# Patient Record
Sex: Female | Born: 1970 | Race: White | Hispanic: No | Marital: Married | Smoking: Former smoker
Health system: Southern US, Community
[De-identification: ages and names within clinical notes are randomized; demographics above are authoritative.]

## PROBLEM LIST (undated history)

## (undated) DIAGNOSIS — M5030 Other cervical disc degeneration, unspecified cervical region: Secondary | ICD-10-CM

## (undated) DIAGNOSIS — F419 Anxiety disorder, unspecified: Secondary | ICD-10-CM

## (undated) DIAGNOSIS — C801 Malignant (primary) neoplasm, unspecified: Secondary | ICD-10-CM

## (undated) DIAGNOSIS — K219 Gastro-esophageal reflux disease without esophagitis: Secondary | ICD-10-CM

## (undated) DIAGNOSIS — J45909 Unspecified asthma, uncomplicated: Secondary | ICD-10-CM

## (undated) HISTORY — PX: LASER ABLATION CONDYLOMA CERVICAL / VULVAR: SUR819

## (undated) HISTORY — PX: CHOLECYSTECTOMY: SHX55

## (undated) HISTORY — PX: APPENDECTOMY: SHX54

---

## 2015-03-17 ENCOUNTER — Encounter (HOSPITAL_COMMUNITY): Payer: Self-pay | Admitting: Cardiology

## 2015-03-17 ENCOUNTER — Emergency Department (HOSPITAL_COMMUNITY)
Admission: EM | Admit: 2015-03-17 | Discharge: 2015-03-17 | Disposition: A | Discharge status: Home / Self Care | Payer: Self-pay | Attending: Emergency Medicine | Admitting: Emergency Medicine

## 2015-03-17 ENCOUNTER — Emergency Department (HOSPITAL_COMMUNITY): Payer: Self-pay

## 2015-03-17 DIAGNOSIS — Z3202 Encounter for pregnancy test, result negative: Secondary | ICD-10-CM | POA: Insufficient documentation

## 2015-03-17 DIAGNOSIS — Z720 Tobacco use: Secondary | ICD-10-CM | POA: Insufficient documentation

## 2015-03-17 DIAGNOSIS — M546 Pain in thoracic spine: Secondary | ICD-10-CM | POA: Insufficient documentation

## 2015-03-17 DIAGNOSIS — M542 Cervicalgia: Secondary | ICD-10-CM | POA: Insufficient documentation

## 2015-03-17 DIAGNOSIS — R079 Chest pain, unspecified: Secondary | ICD-10-CM | POA: Insufficient documentation

## 2015-03-17 HISTORY — DX: Other cervical disc degeneration, unspecified cervical region: M50.30

## 2015-03-17 LAB — URINALYSIS, ROUTINE W REFLEX MICROSCOPIC
BILIRUBIN URINE: NEGATIVE
Glucose, UA: NEGATIVE mg/dL
Hgb urine dipstick: NEGATIVE
Ketones, ur: NEGATIVE mg/dL
Leukocytes, UA: NEGATIVE
NITRITE: NEGATIVE
PROTEIN: NEGATIVE mg/dL
SPECIFIC GRAVITY, URINE: 1.02 (ref 1.005–1.030)
UROBILINOGEN UA: 0.2 mg/dL (ref 0.0–1.0)
pH: 6 (ref 5.0–8.0)

## 2015-03-17 LAB — COMPREHENSIVE METABOLIC PANEL
ALBUMIN: 4 g/dL (ref 3.5–5.0)
ALT: 19 U/L (ref 14–54)
ANION GAP: 5 (ref 5–15)
AST: 20 U/L (ref 15–41)
Alkaline Phosphatase: 94 U/L (ref 38–126)
BUN: 14 mg/dL (ref 6–20)
CO2: 28 mmol/L (ref 22–32)
Calcium: 9.5 mg/dL (ref 8.9–10.3)
Chloride: 107 mmol/L (ref 101–111)
Creatinine, Ser: 0.65 mg/dL (ref 0.44–1.00)
GFR calc Af Amer: >60 mL/min (ref >60)
GFR calc non Af Amer: >60 mL/min (ref >60)
GLUCOSE: 101 mg/dL — AB (ref 65–99)
POTASSIUM: 3.8 mmol/L (ref 3.5–5.1)
SODIUM: 140 mmol/L (ref 135–145)
Total Bilirubin: 0.6 mg/dL (ref 0.3–1.2)
Total Protein: 6.9 g/dL (ref 6.5–8.1)

## 2015-03-17 LAB — CBC WITH DIFFERENTIAL/PLATELET
BASOS PCT: 1 % (ref 0–1)
Basophils Absolute: 0.1 10*3/uL (ref 0.0–0.1)
EOS ABS: 0.1 10*3/uL (ref 0.0–0.7)
Eosinophils Relative: 1 % (ref 0–5)
HCT: 46.8 % — ABNORMAL HIGH (ref 36.0–46.0)
Hemoglobin: 16.2 g/dL — ABNORMAL HIGH (ref 12.0–15.0)
Lymphocytes Relative: 20 % (ref 12–46)
Lymphs Abs: 2.2 10*3/uL (ref 0.7–4.0)
MCH: 32.1 pg (ref 26.0–34.0)
MCHC: 34.6 g/dL (ref 30.0–36.0)
MCV: 92.7 fL (ref 78.0–100.0)
MONO ABS: 0.8 10*3/uL (ref 0.1–1.0)
MONOS PCT: 7 % (ref 3–12)
NEUTROS PCT: 71 % (ref 43–77)
Neutro Abs: 8 10*3/uL — ABNORMAL HIGH (ref 1.7–7.7)
PLATELETS: 236 10*3/uL (ref 150–400)
RBC: 5.05 MIL/uL (ref 3.87–5.11)
RDW: 14.4 % (ref 11.5–15.5)
WBC: 11.2 10*3/uL — ABNORMAL HIGH (ref 4.0–10.5)

## 2015-03-17 LAB — TROPONIN I: Troponin I: <0.03 ng/mL (ref <0.031)

## 2015-03-17 LAB — POC URINE PREG, ED: PREG TEST UR: NEGATIVE

## 2015-03-17 MED ORDER — TRAMADOL HCL 50 MG PO TABS: 50.0000 mg | ORAL_TABLET | Freq: Four times a day (QID) | ORAL | Status: DC | PRN

## 2015-03-17 MED ORDER — IBUPROFEN 800 MG PO TABS: 800.0000 mg | ORAL_TABLET | Freq: Three times a day (TID) | ORAL | Status: DC

## 2015-03-17 MED ORDER — MORPHINE SULFATE (PF) 4 MG/ML IV SOLN
4.0000 mg | Freq: Once | INTRAVENOUS | Status: AC
  Administered 2015-03-17: 4 mg via INTRAVENOUS
  Filled 2015-03-17: qty 1

## 2015-03-17 MED ORDER — CYCLOBENZAPRINE HCL 10 MG PO TABS: 10.0000 mg | ORAL_TABLET | Freq: Three times a day (TID) | ORAL | Status: DC | PRN

## 2015-03-17 MED ORDER — IOHEXOL 350 MG/ML SOLN
100.0000 mL | Freq: Once | INTRAVENOUS | Status: AC | PRN
  Administered 2015-03-17: 100 mL via INTRAVENOUS

## 2015-03-17 MED ORDER — ONDANSETRON HCL 4 MG/2ML IJ SOLN
4.0000 mg | Freq: Once | INTRAMUSCULAR | Status: AC
  Administered 2015-03-17: 4 mg via INTRAVENOUS
  Filled 2015-03-17: qty 2

## 2015-03-17 NOTE — ED Notes (Signed)
Chest pain and pain between shoulder blades since lunch.

## 2015-03-17 NOTE — ED Notes (Signed)
Patient requesting pain medication. MD notified.  

## 2015-03-17 NOTE — ED Provider Notes (Signed)
CSN: [id number]     Arrival date & time 03/17/15  1717 History   First MD Initiated Contact with Patient 03/17/15 1724     Chief Complaint  Patient presents with  . Chest Pain     (Consider location/radiation/quality/duration/timing/severity/associated sxs/prior Treatment) HPI Comments: Patient presents to the ER for evaluation of left-sided chest pain and pain between her shoulder blades. Patient reports that symptoms began 45 hours ago. She laid down into the nap, but pain was still there when she woke up. She did take an aspirin prior to arrival. Patient is not expressing any shortness of breath. She does report that she is very uncomfortable, pain worsens when she moves.  Patient is a 44 y.o. female presenting with chest pain.  Chest Pain Associated symptoms: back pain     Past Medical History  Diagnosis Date  . DDD (degenerative disc disease), cervical    Past Surgical History  Procedure Laterality Date  . Cholecystectomy    . Appendectomy     History reviewed. No pertinent family history. Social History  Substance Use Topics  . Smoking status: Current Every Day Smoker  . Smokeless tobacco: None  . Alcohol Use: Yes     Comment: occasional   OB History    No data available     Review of Systems  Cardiovascular: Positive for chest pain.  Musculoskeletal: Positive for back pain.  All other systems reviewed and are negative.     Allergies  Clindamycin/lincomycin  Home Medications   Prior to Admission medications   Not on File   BP 124/67 mmHg  Pulse 62  Temp(Src) 98.7 F (37.1 C) (Oral)  Resp 16  Ht 5\' 5"  (1.651 m)  Wt 203 lb (92.08 kg)  BMI 33.78 kg/m2  SpO2 100%  LMP 02/23/2015 Physical Exam  Constitutional: She is oriented to person, place, and time. She appears well-developed and well-nourished. No distress.  HENT:  Head: Normocephalic and atraumatic.  Right Ear: Hearing normal.  Left Ear: Hearing normal.  Nose: Nose normal.  Mouth/Throat:  Oropharynx is clear and moist and mucous membranes are normal.  Eyes: Conjunctivae and EOM are normal. Pupils are equal, round, and reactive to light.  Neck: Neck supple. Muscular tenderness (left paraspinal) present. Decreased range of motion (due to pain) present.  Cardiovascular: Regular rhythm, S1 normal and S2 normal.  Exam reveals no gallop and no friction rub.   No murmur heard. Pulmonary/Chest: Effort normal and breath sounds normal. No respiratory distress. She exhibits no tenderness.  Abdominal: Soft. Normal appearance and bowel sounds are normal. There is no hepatosplenomegaly. There is no tenderness. There is no rebound, no guarding, no tenderness at McBurney's point and negative Murphy's sign. No hernia.  Musculoskeletal:       Thoracic back: She exhibits tenderness.       Back:  Neurological: She is alert and oriented to person, place, and time. She has normal strength. No cranial nerve deficit or sensory deficit. Coordination normal. GCS eye subscore is 4. GCS verbal subscore is 5. GCS motor subscore is 6.  Skin: Skin is warm, dry and intact. No rash noted. No cyanosis.  Psychiatric: She has a normal mood and affect. Her speech is normal and behavior is normal. Thought content normal.  Nursing note and vitals reviewed.   ED Course  Procedures (including critical care time) Labs Review Labs Reviewed  CBC WITH DIFFERENTIAL/PLATELET - Abnormal; Notable for the following:    WBC 11.2 (*)    Hemoglobin 16.2 (*)  HCT 46.8 (*)    Neutro Abs 8.0 (*)    All other components within normal limits  COMPREHENSIVE METABOLIC PANEL - Abnormal; Notable for the following:    Glucose, Bld 101 (*)    All other components within normal limits  TROPONIN I  URINALYSIS, ROUTINE W REFLEX MICROSCOPIC (NOT AT [facility])  TROPONIN I  POC URINE PREG, ED    Imaging Review Ct Angio Chest Pe W/cm &/or Wo Cm  03/17/2015   CLINICAL DATA:  Acute onset of interscapular posterior chest pain which began  earlier today.  EXAM: CT ANGIOGRAPHY CHEST WITH CONTRAST  TECHNIQUE: Multidetector CT imaging of the chest was performed using the standard protocol during bolus administration of intravenous contrast. Multiplanar CT image reconstructions and MIPs were obtained to evaluate the vascular anatomy.  CONTRAST:  100 mL OMNIPAQUE IOHEXOL 350 MG/ML IV.  COMPARISON:  None.  FINDINGS: Contrast opacification of pulmonary arteries is good. No filling defects within either main pulmonary artery or their branches in either lung to suggest pulmonary embolism. Heart size normal. No visible coronary atherosclerosis. No visible atherosclerosis involving the thoracic or upper abdominal aorta. Widely patent great vessels. No pericardial effusion.  Mild emphysematous changes in the upper lobes. Pulmonary parenchyma clear without localized airspace consolidation, interstitial disease, or parenchymal nodules or masses. Central airways patent with mild central bronchial wall thickening. No pleural effusions.  No significant mediastinal, hilar or axillary lymphadenopathy. Visualized thyroid gland unremarkable.  Anatomic variant in that the left lobe of the liver extends across the midline into the left upper quadrant. Gallbladder surgically absent. Visualized upper abdomen unremarkable. Bone window images unremarkable apart from minimal lower thoracic spondylosis.  Review of the MIP images confirms the above findings.  IMPRESSION: 1. No evidence of pulmonary embolism. 2. Mild emphysematous changes in the upper lobes. No acute cardiopulmonary disease.   Electronically Signed   By: Evangeline Dakin M.D.   On: 03/17/2015 20:26   I have personally reviewed and evaluated these images and lab results as part of my medical decision-making.   EKG Interpretation   Date/Time:  Monday March 17 2015 17:27:38 EDT Ventricular Rate:  76 PR Interval:  158 QRS Duration: 79 QT Interval:  365 QTC Calculation: 410 R Axis:   67 Text  Interpretation:  Sinus rhythm Normal ECG Confirmed by Tra Wilemon  MD,  Renley Banwart [phone]) on 03/17/2015 5:35:07 PM      MDM   Final diagnoses:  Chest pain, unspecified chest pain type  Left-sided thoracic back pain    Patient presented to the ER for evaluation of back and chest pain. Pain is between the shoulder blades and goes into the left side of her neck. This pain is worsened with movement. She denied injury. Patient did have reproducible pain with movements here in the ER. EKG was normal. Troponin was negative. CT angiography of chest was performed to evaluate for PE and aortic dissection. No abnormality was noted. Symptoms most consistent with musculoskeletal pain. Second troponin performed and negative. Discharged with analgesia, rest.    Orpah Greek, MD 03/17/15 2037

## 2015-03-17 NOTE — Discharge Instructions (Signed)
Back Pain, Adult °Back pain is very common. The pain often gets better over time. The cause of back pain is usually not dangerous. Most people can learn to manage their back pain on their own.  °HOME CARE  °· Stay active. Start with short walks on flat ground if you can. Try to walk farther each day. °· Do not sit, drive, or stand in one place for more than 30 minutes. Do not stay in bed. °· Do not avoid exercise or work. Activity can help your back heal faster. °· Be careful when you bend or lift an object. Bend at your knees, keep the object close to you, and do not twist. °· Sleep on a firm mattress. Lie on your side, and bend your knees. If you lie on your back, put a pillow under your knees. °· Only take medicines as told by your doctor. °· Put ice on the injured area. °¨ Put ice in a plastic bag. °¨ Place a towel between your skin and the bag. °¨ Leave the ice on for 15-20 minutes, 03-04 times a day for the first 2 to 3 days. After that, you can switch between ice and heat packs. °· Ask your doctor about back exercises or massage. °· Avoid feeling anxious or stressed. Find good ways to deal with stress, such as exercise. °GET HELP RIGHT AWAY IF:  °· Your pain does not go away with rest or medicine. °· Your pain does not go away in 1 week. °· You have new problems. °· You do not feel well. °· The pain spreads into your legs. °· You cannot control when you poop (bowel movement) or pee (urinate). °· Your arms or legs feel weak or lose feeling (numbness). °· You feel sick to your stomach (nauseous) or throw up (vomit). °· You have belly (abdominal) pain. °· You feel like you may pass out (faint). °MAKE SURE YOU:  °· Understand these instructions. °· Will watch your condition. °· Will get help right away if you are not doing well or get worse. °Document Released: 12/22/2007 Document Revised: 09/27/2011 Document Reviewed: 11/06/2013 °Exit[employer]® Patient Information ©2015 [employer]. This information is not intended  to replace advice given to you by your health care provider. Make sure you discuss any questions you have with your health care provider. ° °

## 2017-04-04 ENCOUNTER — Other Ambulatory Visit: Payer: Self-pay | Admitting: Neurosurgery

## 2017-04-05 ENCOUNTER — Encounter: Payer: Self-pay | Admitting: Vascular Surgery

## 2017-04-06 ENCOUNTER — Encounter: Payer: Self-pay | Admitting: Vascular Surgery

## 2017-04-06 ENCOUNTER — Ambulatory Visit (INDEPENDENT_AMBULATORY_CARE_PROVIDER_SITE_OTHER): Payer: BLUE CROSS/BLUE SHIELD | Admitting: Vascular Surgery

## 2017-04-06 VITALS — BP 135/81 | HR 76 | Temp 97.1°F | Resp 18 | Ht 65.0 in | Wt 205.0 lb

## 2017-04-06 DIAGNOSIS — M5137 Other intervertebral disc degeneration, lumbosacral region: Secondary | ICD-10-CM

## 2017-04-06 NOTE — Progress Notes (Signed)
[facility] [place]  Patient name: Amber Duarte MRN: [id number] DOB: January 23, 1971 Sex: female  REASON FOR CONSULT: Discussed my role for exposure for L5-S1 disc surgery  HPI: Amber Duarte is a 46 y.o. female, who is seen today for discussion of anterior exposure for L5-S1 disc surgery. He is very pleasant 46 year old who reports falling January of last year. She's had progressive pain associated with this and now is incapacitated with this. She walks with a rolling walker. She has had failed conservative treatment of her degenerative disc disease. She requires constant narcotics and is getting minimal relief with this. She has pain in her back and also pain down both of her lower extremities. Is no history of cardiac disease and no history of peripheral vascular occlusive disease. She has had prior abdominal surgery with laparoscopic cholecystectomy and also appendectomy. She had cervical cancer is a young woman and had the laser treatment of this.  Past Medical History:  Diagnosis Date  . DDD (degenerative disc disease), cervical     History reviewed. No pertinent family history.  SOCIAL HISTORY: Social History   Social History  . Marital status: Divorced    Spouse name: N/A  . Number of children: N/A  . Years of education: N/A   Occupational History  . Not on file.   Social History Main Topics  . Smoking status: Current Every Day Smoker    Packs/day: 1.00    Types: Cigarettes  . Smokeless tobacco: Never Used  . Alcohol use Yes     Comment: occasional  . Drug use: No  . Sexual activity: Not on file   Other Topics Concern  . Not on file   Social History Narrative  . No narrative on file    Allergies  Allergen Reactions  . Clindamycin/Lincomycin Anaphylaxis    Current Outpatient Prescriptions  Medication Sig Dispense Refill  . carisoprodol (SOMA) 350 MG tablet TK 1 T PO Q 6 H PRN  1  .  HYDROcodone-acetaminophen (NORCO/VICODIN) 5-325 MG tablet TK 1 T PO Q 6 H PRF PAIN  0  . ranitidine (ZANTAC) 150 MG tablet Take 300 mg by mouth 2 (two) times daily.    Marland Kitchen aspirin EC 81 MG tablet Take 81 mg by mouth once as needed for mild pain or moderate pain.    . cyclobenzaprine (FLEXERIL) 10 MG tablet Take 1 tablet (10 mg total) by mouth 3 (three) times daily as needed for muscle spasms. (Patient not taking: Reported on 04/06/2017) 20 tablet 0  . ibuprofen (ADVIL,MOTRIN) 800 MG tablet Take 1 tablet (800 mg total) by mouth 3 (three) times daily. (Patient not taking: Reported on 04/06/2017) 21 tablet 0  . traMADol (ULTRAM) 50 MG tablet Take 1 tablet (50 mg total) by mouth every 6 (six) hours as needed. (Patient not taking: Reported on 04/06/2017) 15 tablet 0   No current facility-administered medications for this visit.     REVIEW OF SYSTEMS:  [X]  denotes positive finding, [ ]  denotes negative finding Cardiac  Comments:  Chest pain or chest pressure:    Shortness of breath upon exertion:    Short of breath when lying flat:    Irregular heart rhythm:        Vascular    Pain in calf, thigh, or hip brought on by ambulation: x Neurogenic   Pain in feet at night that wakes you up from your sleep:     Blood clot in your veins:    Leg  swelling:         Pulmonary    Oxygen at home:    Productive cough:     Wheezing:         Neurologic    Sudden weakness in arms or legs:  x   Sudden numbness in arms or legs:  x   Sudden onset of difficulty speaking or slurred speech:    Temporary loss of vision in one eye:     Problems with dizziness:         Gastrointestinal    Blood in stool:     Vomited blood:         Genitourinary    Burning when urinating:     Blood in urine:        Psychiatric    Major depression:         Hematologic    Bleeding problems:    Problems with blood clotting too easily:        Skin    Rashes or ulcers:        Constitutional    Fever or chills:       PHYSICAL EXAM: Vitals:   04/06/17 0859  BP: 135/81  Pulse: 76  Resp: 18  Temp: (!) 97.1 F (36.2 C)  TempSrc: Oral  SpO2: 98%  Weight: 205 lb (93 kg)  Height: 5\' 5"  (1.651 m)    GENERAL: The patient is a well-nourished female, in no acute distress. The vital signs are documented above. CARDIOVASCULAR: Palpable radial and palpable dorsalis pedis pulses bilaterally. Carotid arteries without bruits bilaterally PULMONARY: There is good air exchange  ABDOMEN: Soft and non-tender  MUSCULOSKELETAL: There are no major deformities or cyanosis. NEUROLOGIC: No focal weakness or paresthesias are detected. SKIN: There are no ulcers or rashes noted. PSYCHIATRIC: The patient has a normal affect.    MEDICAL ISSUES: Severe degenerative disc disease. Dr. Trenton Gammon has recommended surgery for improvement of her pain. Has recommended treatment of the L5-S1 disc from an anterior approach. I explained my role and exposure. Explained the transverse incision in the left lower quadrant and mobilization of the rectus muscle. Discussed retroperitoneal exposure and mobilization of the left ureter and also mobilization of the arterial venous structures overlying the spine. Discussed the potential of injury for all of these. Also discussed the possibility of a postop wound difficulty particularly in light of her obesity.  I do not see any contraindications to the anterior exposure from my standpoint. She has had no prior pelvic surgery. She is obese making this exposure somewhat more challenging but not prohibitive. We are available for assistance when she is scheduled for surgery   Rosetta Posner, MD [facility] Tel [phone] Pager [phone]

## 2017-04-11 ENCOUNTER — Other Ambulatory Visit: Payer: Self-pay

## 2017-04-11 NOTE — Pre-Procedure Instructions (Signed)
Rosella Crandell  04/11/2017      [id number] - [place], [place] - [address] [place] [address] Phone: [phone] Fax: [phone]  [facility] [place], [place] [address] Phone: [phone] Fax: [phone]    Your procedure is scheduled on Monday October 1.  Report to [facility] Admitting at 5:30 A.M.  Call this number if you have problems the morning of surgery:  [phone]   Remember:  Do not eat food or drink liquids after midnight.  Take these medicines the morning of surgery with A SIP OF WATER: ranitidine (zantac), hydrocodone (Norco) if needed, carisoprodol (soma)   7 days prior to surgery STOP taking any Aspirin, Aleve, Naproxen, Ibuprofen, Motrin, Advil, Goody's, BC's, all herbal medications, fish oil, and all vitamins    Do not wear jewelry, make-up or nail polish.  Do not wear lotions, powders, or perfumes, or deoderant.  Do not shave 48 hours prior to surgery.  Men may shave face and neck.  Do not bring valuables to the hospital.  [facility] is not responsible for any belongings or valuables.  Contacts, dentures or bridgework may not be worn into surgery.  Leave your suitcase in the car.  After surgery it may be brought to your room.  For patients admitted to the hospital, discharge time will be determined by your treatment team.  Patients discharged the day of surgery will not be allowed to drive home.    Special instructions:    [facility]- Preparing For Surgery  Before surgery, you can play an important role. Because skin is not sterile, your skin needs to be as free of germs as possible. You can reduce the number of germs on your skin by washing with CHG (chlorahexidine gluconate) Soap before surgery.  CHG is an antiseptic cleaner which kills germs and bonds with the skin to continue killing germs even  after washing.  Please do not use if you have an allergy to CHG or antibacterial soaps. If your skin becomes reddened/irritated stop using the CHG.  Do not shave (including legs and underarms) for at least 48 hours prior to first CHG shower. It is OK to shave your face.  Please follow these instructions carefully.   1. Shower the NIGHT BEFORE SURGERY and the MORNING OF SURGERY with CHG.   2. If you chose to wash your hair, wash your hair first as usual with your normal shampoo.  3. After you shampoo, rinse your hair and body thoroughly to remove the shampoo.  4. Use CHG as you would any other liquid soap. You can apply CHG directly to the skin and wash gently with a scrungie or a clean washcloth.   5. Apply the CHG Soap to your body ONLY FROM THE NECK DOWN.  Do not use on open wounds or open sores. Avoid contact with your eyes, ears, mouth and genitals (private parts). Wash genitals (private parts) with your normal soap.  6. Wash thoroughly, paying special attention to the area where your surgery will be performed.  7. Thoroughly rinse your body with warm water from the neck down.  8. DO NOT shower/wash with your normal soap after using and rinsing off the CHG Soap.  9. Pat yourself dry with a CLEAN TOWEL.   10. Wear CLEAN PAJAMAS   11. Place CLEAN SHEETS on your  bed the night of your first shower and DO NOT SLEEP WITH PETS.    Day of Surgery: Do not apply any deodorants/lotions. Please wear clean clothes to the hospital/surgery center.      Please read over the following fact sheets that you were given. MRSA Information

## 2017-04-12 ENCOUNTER — Encounter (HOSPITAL_COMMUNITY)
Admission: RE | Admit: 2017-04-12 | Discharge: 2017-04-12 | Disposition: A | Discharge status: Home / Self Care | Payer: BLUE CROSS/BLUE SHIELD | Source: Ambulatory Visit | Attending: Neurosurgery | Admitting: Neurosurgery

## 2017-04-12 ENCOUNTER — Encounter (HOSPITAL_COMMUNITY): Payer: Self-pay | Admitting: Urology

## 2017-04-12 DIAGNOSIS — Z0183 Encounter for blood typing: Secondary | ICD-10-CM | POA: Diagnosis not present

## 2017-04-12 DIAGNOSIS — M5126 Other intervertebral disc displacement, lumbar region: Secondary | ICD-10-CM | POA: Diagnosis not present

## 2017-04-12 DIAGNOSIS — Z01812 Encounter for preprocedural laboratory examination: Secondary | ICD-10-CM | POA: Insufficient documentation

## 2017-04-12 HISTORY — DX: Malignant (primary) neoplasm, unspecified: C80.1

## 2017-04-12 HISTORY — DX: Gastro-esophageal reflux disease without esophagitis: K21.9

## 2017-04-12 HISTORY — DX: Anxiety disorder, unspecified: F41.9

## 2017-04-12 LAB — SURGICAL PCR SCREEN
MRSA, PCR: NEGATIVE
STAPHYLOCOCCUS AUREUS: NEGATIVE

## 2017-04-12 LAB — CBC WITH DIFFERENTIAL/PLATELET
BASOS ABS: 0.1 10*3/uL (ref 0.0–0.1)
BASOS PCT: 0 %
Eosinophils Absolute: 0.1 10*3/uL (ref 0.0–0.7)
Eosinophils Relative: 0 %
HEMATOCRIT: 42.7 % (ref 36.0–46.0)
HEMOGLOBIN: 14.5 g/dL (ref 12.0–15.0)
LYMPHS PCT: 18 %
Lymphs Abs: 2.6 10*3/uL (ref 0.7–4.0)
MCH: 31.7 pg (ref 26.0–34.0)
MCHC: 34 g/dL (ref 30.0–36.0)
MCV: 93.2 fL (ref 78.0–100.0)
MONOS PCT: 6 %
Monocytes Absolute: 0.8 10*3/uL (ref 0.1–1.0)
NEUTROS ABS: 10.8 10*3/uL — AB (ref 1.7–7.7)
NEUTROS PCT: 76 %
Platelets: 214 10*3/uL (ref 150–400)
RBC: 4.58 MIL/uL (ref 3.87–5.11)
RDW: 13.1 % (ref 11.5–15.5)
WBC: 14.2 10*3/uL — ABNORMAL HIGH (ref 4.0–10.5)

## 2017-04-12 LAB — BASIC METABOLIC PANEL
Anion gap: 7 (ref 5–15)
BUN: 8 mg/dL (ref 6–20)
CALCIUM: 8.5 mg/dL — AB (ref 8.9–10.3)
CO2: 22 mmol/L (ref 22–32)
CREATININE: 0.69 mg/dL (ref 0.44–1.00)
Chloride: 103 mmol/L (ref 101–111)
Glucose, Bld: 97 mg/dL (ref 65–99)
Potassium: 3.8 mmol/L (ref 3.5–5.1)
SODIUM: 132 mmol/L — AB (ref 135–145)

## 2017-04-12 LAB — TYPE AND SCREEN
ABO/RH(D): B POS
ANTIBODY SCREEN: NEGATIVE

## 2017-04-12 LAB — HCG, SERUM, QUALITATIVE: Preg, Serum: NEGATIVE

## 2017-04-12 LAB — ABO/RH: ABO/RH(D): B POS

## 2017-04-12 MED ORDER — CHLORHEXIDINE GLUCONATE CLOTH 2 % EX PADS: 6.0000 | MEDICATED_PAD | Freq: Once | CUTANEOUS | Status: DC

## 2017-04-12 NOTE — Progress Notes (Signed)
Pt denies PCP, denies cardiac hx or cardiac workup  Patient denies shortness of breath, fever, cough and chest pain at PAT appointment   Patient verbalized understanding of instructions that were given to them at the PAT appointment. Patient was also instructed that they will need to review over the PAT instructions again at home before surgery.

## 2017-04-17 ENCOUNTER — Emergency Department (HOSPITAL_COMMUNITY)
Admission: EM | Admit: 2017-04-17 | Discharge: 2017-04-17 | Disposition: A | Discharge status: Home / Self Care | Payer: BLUE CROSS/BLUE SHIELD | Attending: Emergency Medicine | Admitting: Emergency Medicine

## 2017-04-17 ENCOUNTER — Encounter (HOSPITAL_COMMUNITY): Payer: Self-pay | Admitting: *Deleted

## 2017-04-17 ENCOUNTER — Emergency Department (HOSPITAL_COMMUNITY): Payer: BLUE CROSS/BLUE SHIELD

## 2017-04-17 DIAGNOSIS — M5417 Radiculopathy, lumbosacral region: Secondary | ICD-10-CM | POA: Insufficient documentation

## 2017-04-17 DIAGNOSIS — G8929 Other chronic pain: Secondary | ICD-10-CM | POA: Diagnosis not present

## 2017-04-17 DIAGNOSIS — M5416 Radiculopathy, lumbar region: Secondary | ICD-10-CM

## 2017-04-17 DIAGNOSIS — M545 Low back pain: Secondary | ICD-10-CM | POA: Diagnosis present

## 2017-04-17 DIAGNOSIS — F17210 Nicotine dependence, cigarettes, uncomplicated: Secondary | ICD-10-CM | POA: Diagnosis not present

## 2017-04-17 MED ORDER — DIAZEPAM 5 MG PO TABS
5.0000 mg | ORAL_TABLET | Freq: Once | ORAL | Status: AC
  Administered 2017-04-17: 5 mg via ORAL
  Filled 2017-04-17: qty 1

## 2017-04-17 MED ORDER — KETOROLAC TROMETHAMINE 30 MG/ML IJ SOLN
30.0000 mg | Freq: Once | INTRAMUSCULAR | Status: AC
  Administered 2017-04-17: 30 mg via INTRAMUSCULAR
  Filled 2017-04-17: qty 1

## 2017-04-17 MED ORDER — HYDROMORPHONE HCL 1 MG/ML IJ SOLN
1.0000 mg | Freq: Once | INTRAMUSCULAR | Status: AC
  Administered 2017-04-17: 1 mg via INTRAMUSCULAR
  Filled 2017-04-17: qty 1

## 2017-04-17 MED ORDER — METHYLPREDNISOLONE 4 MG PO TBPK: ORAL_TABLET | ORAL | 0 refills | Status: AC

## 2017-04-17 NOTE — ED Triage Notes (Signed)
Pt has hx of chronic back injury from this past january, was turning in her bed tonight when she "felt a pop"

## 2017-04-17 NOTE — ED Provider Notes (Signed)
[facility] DEPT Provider Note   CSN: [id number] Arrival date & time: 04/17/17  0039     History   Chief Complaint Chief Complaint  Patient presents with  . Back Pain    HPI Amber Duarte is a 46 y.o. female.  Patient presents with acute on chronic low back pain that worsened after she turned in bed this evening. States she has back pain chronically since an injury in January 2017. She is scheduled for lumbar fusion by Dr. Trenton Gammon in December. She takes Norco every 6 hours for pain as well as Soma on a regular basis. She always has some back pain that became acutely worse today when she turned in bed. The pain radiates down her left leg. Is associated with numbness in her left leg which is chronic. She denies any new weakness, numbness or tingling. No fever or vomiting. No bowel or bladder incontinence. No chest pain or shortness of breath.   The history is provided by the patient.  Back Pain   Associated symptoms include numbness. Pertinent negatives include no chest pain, no fever, no abdominal pain and no dysuria.    Past Medical History:  Diagnosis Date  . Anxiety   . Cancer [facility])    history of cervical cancer  . DDD (degenerative disc disease), cervical   . GERD (gastroesophageal reflux disease)     There are no active problems to display for this patient.   Past Surgical History:  Procedure Laterality Date  . APPENDECTOMY    . CHOLECYSTECTOMY    . LASER ABLATION CONDYLOMA CERVICAL / VULVAR     laser to cervical d/t cervical cancer 1990    OB History    No data available       Home Medications    Prior to Admission medications   Medication Sig Start Date End Date Taking? Authorizing Provider  carisoprodol (SOMA) 350 MG tablet Take 350 mg by mouth every 6 (six) hours as needed for muscle spasms.   Yes [provider]  HYDROcodone-acetaminophen (NORCO/VICODIN) 5-325 MG tablet Take 1 tablet by mouth every 6 (six) hours as needed (for pain).   Yes  [provider]  ranitidine (ZANTAC) 150 MG tablet Take 150 mg by mouth 2 (two) times daily as needed (for heartburn/indigestion.).    Yes [provider]    Family History No family history on file.  Social History Social History  Substance Use Topics  . Smoking status: Current Every Day Smoker    Packs/day: 1.00    Types: Cigarettes  . Smokeless tobacco: Never Used  . Alcohol use Yes     Comment: occasional     Allergies   Clindamycin/lincomycin   Review of Systems Review of Systems  Constitutional: Negative for activity change, appetite change and fever.  Respiratory: Negative for cough, chest tightness and shortness of breath.   Cardiovascular: Negative for chest pain.  Gastrointestinal: Negative for abdominal pain, nausea and vomiting.  Genitourinary: Negative for dysuria, hematuria and urgency.  Musculoskeletal: Positive for back pain.  Skin: Negative for rash.  Neurological: Positive for numbness.    all other systems are negative except as noted in the HPI and PMH.    Physical Exam Updated Vital Signs BP 112/86   Pulse 90   Temp 98.3 F (36.8 C)   Resp 20   Ht 5\' 5"  (1.651 m)   Wt 94.8 kg (209 lb)   LMP 04/11/2017   SpO2 100%   BMI 34.78 kg/m   Physical  Exam  Constitutional: She is oriented to person, place, and time. She appears well-developed and well-nourished. No distress.  HENT:  Head: Normocephalic and atraumatic.  Mouth/Throat: Oropharynx is clear and moist. No oropharyngeal exudate.  Eyes: Pupils are equal, round, and reactive to light. Conjunctivae and EOM are normal.  Neck: Normal range of motion. Neck supple.  No meningismus.  Cardiovascular: Normal rate, regular rhythm, normal heart sounds and intact distal pulses.   No murmur heard. Pulmonary/Chest: Effort normal and breath sounds normal. No respiratory distress.  Abdominal: Soft. There is no tenderness. There is no rebound and no guarding.  Musculoskeletal: Normal  range of motion. She exhibits tenderness. She exhibits no edema.  Tenderness to palpation in midline lumbar spine Patient able to change positions on and off the stretcher without difficulty 5/5 strength in bilateral lower extremities. Ankle plantar and dorsiflexion intact. Great toe extension intact bilaterally. +2 DP and PT pulses. +2 patellar reflexes bilaterally. Normal gait.   Neurological: She is alert and oriented to person, place, and time. No cranial nerve deficit. She exhibits normal muscle tone. Coordination normal.   5/5 strength throughout. CN 2-12 intact.Equal grip strength.   Skin: Skin is warm.  Psychiatric: She has a normal mood and affect. Her behavior is normal.  Nursing note and vitals reviewed.    ED Treatments / Results  Labs (all labs ordered are listed, but only abnormal results are displayed) Labs Reviewed - No data to display  EKG  EKG Interpretation None       Radiology No results found.  Procedures Procedures (including critical care time)  Medications Ordered in ED Medications  HYDROmorphone (DILAUDID) injection 1 mg (not administered)  ketorolac (TORADOL) 30 MG/ML injection 30 mg (not administered)  diazepam (VALIUM) tablet 5 mg (not administered)     Initial Impression / Assessment and Plan / ED Course  I have reviewed the triage vital signs and the nursing notes.  Pertinent labs & imaging results that were available during my care of the patient were reviewed by me and considered in my medical decision making (see chart for details).    Patient with worsening of her chronic back pain after turning in bed and feeling a pop. Denies any new weakness, numbness or tingling. No bowel or bladder incontinence.  Low Suspicion for cord compression or cauda equina.  Patient is scheduled for lumbar fusion surgery by Dr. Trenton Gammon December 2018. Patient has MRI report from May 2018 that shows severe disc degeneration endplate changes at L5 and S1.  There is a broad-based disc herniation and severe bilateral foraminal stenosis affecting both exiting L5 nerve roots. There is a small annular tear at L4 and L5 without evidence of disc herniation or stenosis.  Patient treated for pain in the ED. She has narcotics and muscle relaxers at home. Will add course of steroids and follow-up with her neurosurgeon next week. She is able to ambulate. She has good strength and sensation in her legs with intact reflexes. Low suspicion for cord compression or cauda equina. Return precautions discussed.  Final Clinical Impressions(s) / ED Diagnoses   Final diagnoses:  Lumbar back pain with radiculopathy affecting left lower extremity    New Prescriptions New Prescriptions   No medications on file     Ezequiel Essex, MD 04/17/17 [phone]

## 2017-04-17 NOTE — Discharge Instructions (Signed)
Your Xray is stable. Take the steroids as prescribed and your pain medications as directed. Return to the ED if you develop new weakness, numbness, incontinence, fever, vomiting, or any other concerns.

## 2017-04-26 ENCOUNTER — Other Ambulatory Visit: Payer: Self-pay | Admitting: *Deleted

## 2017-04-27 ENCOUNTER — Other Ambulatory Visit: Payer: Self-pay | Admitting: *Deleted

## 2017-05-20 ENCOUNTER — Other Ambulatory Visit: Payer: Self-pay | Admitting: *Deleted

## 2017-05-26 ENCOUNTER — Other Ambulatory Visit: Payer: Self-pay | Admitting: *Deleted

## 2017-07-04 ENCOUNTER — Inpatient Hospital Stay (HOSPITAL_COMMUNITY): Admission: RE | Admit: 2017-07-04 | Payer: BLUE CROSS/BLUE SHIELD | Source: Ambulatory Visit | Admitting: Neurosurgery

## 2017-07-04 ENCOUNTER — Encounter (HOSPITAL_COMMUNITY): Admission: RE | Payer: Self-pay | Source: Ambulatory Visit

## 2017-07-04 SURGERY — ANTERIOR LUMBAR FUSION 1 LEVEL
Anesthesia: General

## 2019-03-21 ENCOUNTER — Encounter (HOSPITAL_COMMUNITY): Payer: Self-pay | Admitting: Emergency Medicine

## 2019-03-21 ENCOUNTER — Other Ambulatory Visit: Payer: Self-pay

## 2019-03-21 ENCOUNTER — Emergency Department (HOSPITAL_COMMUNITY)
Admission: EM | Admit: 2019-03-21 | Discharge: 2019-03-21 | Disposition: A | Discharge status: Home / Self Care | Payer: BLUE CROSS/BLUE SHIELD | Attending: Emergency Medicine | Admitting: Emergency Medicine

## 2019-03-21 DIAGNOSIS — J45909 Unspecified asthma, uncomplicated: Secondary | ICD-10-CM | POA: Insufficient documentation

## 2019-03-21 DIAGNOSIS — F17210 Nicotine dependence, cigarettes, uncomplicated: Secondary | ICD-10-CM | POA: Insufficient documentation

## 2019-03-21 DIAGNOSIS — L739 Follicular disorder, unspecified: Secondary | ICD-10-CM | POA: Insufficient documentation

## 2019-03-21 DIAGNOSIS — Z8541 Personal history of malignant neoplasm of cervix uteri: Secondary | ICD-10-CM | POA: Insufficient documentation

## 2019-03-21 DIAGNOSIS — F1210 Cannabis abuse, uncomplicated: Secondary | ICD-10-CM | POA: Insufficient documentation

## 2019-03-21 HISTORY — DX: Unspecified asthma, uncomplicated: J45.909

## 2019-03-21 LAB — CBG MONITORING, ED: Glucose-Capillary: 100 mg/dL — ABNORMAL HIGH (ref 70–99)

## 2019-03-21 MED ORDER — OXYCODONE-ACETAMINOPHEN 5-325 MG PO TABS
1.0000 | ORAL_TABLET | Freq: Once | ORAL | Status: AC
  Administered 2019-03-21: 20:00:00 1 via ORAL
  Filled 2019-03-21: qty 1

## 2019-03-21 MED ORDER — MUPIROCIN 2 % EX OINT: TOPICAL_OINTMENT | CUTANEOUS | 0 refills | Status: AC

## 2019-03-21 MED ORDER — DOXYCYCLINE HYCLATE 100 MG PO TABS
100.0000 mg | ORAL_TABLET | Freq: Once | ORAL | Status: AC
  Administered 2019-03-21: 20:00:00 100 mg via ORAL
  Filled 2019-03-21: qty 1

## 2019-03-21 MED ORDER — DOXYCYCLINE HYCLATE 100 MG PO CAPS: 100.0000 mg | ORAL_CAPSULE | Freq: Two times a day (BID) | ORAL | 0 refills | Status: AC

## 2019-03-21 MED ORDER — OXYCODONE-ACETAMINOPHEN 5-325 MG PO TABS: 1.0000 | ORAL_TABLET | ORAL | 0 refills | Status: AC | PRN

## 2019-03-21 NOTE — Discharge Instructions (Addendum)
Apply warm wet compresses or warm water soaks 2-3 times a day until the area heals.  Dry the area thoroughly prior to applying the ointment.  Take the antibiotic as directed until it is finished.  Return here in 2 days for recheck if not improving.

## 2019-03-21 NOTE — ED Provider Notes (Signed)
[facility] EMERGENCY DEPARTMENT Provider Note   CSN: [id number] Arrival date & time: 03/21/19  1820     History   Chief Complaint Chief Complaint  Patient presents with  . Rash    HPI Amber Duarte is a 48 y.o. female.     HPI   Amber Duarte is a 48 y.o. female who presents to the Emergency Department complaining of pain, redness, and "bumps" to the skin of her upper vagina and inner thighs.  Symptoms have been present for 2 days.  She admits to using a razor to shave her genital area.  She noticed the redness in multiple small pustules after shaving.  She reports pain associated with palpation and with walking.  She describes the pain is constant.  She has applied over-the-counter first-aid ointment without relief.  She denies fever, chills, abdominal pain, dysuria vomiting or red streaking.  No vaginal discharge or abnormal vaginal bleeding.    Past Medical History:  Diagnosis Date  . Anxiety   . Asthma   . Cancer [facility])    history of cervical cancer  . DDD (degenerative disc disease), cervical   . GERD (gastroesophageal reflux disease)     There are no active problems to display for this patient.   Past Surgical History:  Procedure Laterality Date  . APPENDECTOMY    . CHOLECYSTECTOMY    . LASER ABLATION CONDYLOMA CERVICAL / VULVAR     laser to cervical d/t cervical cancer 1990     OB History    Gravida      Para      Term      Preterm      AB      Living  0     SAB      TAB      Ectopic      Multiple      Live Births               Home Medications    Prior to Admission medications   Medication Sig Start Date End Date Taking? Authorizing Provider  carisoprodol (SOMA) 350 MG tablet Take 350 mg by mouth every 6 (six) hours as needed for muscle spasms.    [provider]  HYDROcodone-acetaminophen (NORCO/VICODIN) 5-325 MG tablet Take 1 tablet by mouth every 6 (six) hours as needed (for pain).    [provider]   methylPREDNISolone (MEDROL DOSEPAK) 4 MG TBPK tablet As directed 04/17/17   Rancour, Annie Main, MD  ranitidine (ZANTAC) 150 MG tablet Take 150 mg by mouth 2 (two) times daily as needed (for heartburn/indigestion.).     [provider]    Family History History reviewed. No pertinent family history.  Social History Social History   Tobacco Use  . Smoking status: Current Every Day Smoker    Packs/day: 1.00    Types: Cigarettes  . Smokeless tobacco: Never Used  Substance Use Topics  . Alcohol use: Not Currently    Comment: occasional  . Drug use: Not Currently    Types: Marijuana    Comment: tried to help with pain once     Allergies   Clindamycin/lincomycin   Review of Systems Review of Systems  Constitutional: Negative for activity change, appetite change, chills and fever.  HENT: Negative for facial swelling, sore throat and trouble swallowing.   Respiratory: Negative for chest tightness, shortness of breath and wheezing.   Cardiovascular: Negative for chest pain.  Genitourinary: Negative for dysuria, flank pain, hematuria, vaginal bleeding  and vaginal discharge.  Musculoskeletal: Negative for neck pain and neck stiffness.  Skin: Positive for rash. Negative for wound.  Neurological: Negative for dizziness, weakness, numbness and headaches.     Physical Exam Updated Vital Signs BP 139/80 (BP Location: Right Arm)   Pulse 98   Temp 98.7 F (37.1 C) (Oral)   Resp 18   Ht 5\' 4"  (1.626 m)   Wt 83 kg   LMP 03/05/2019   SpO2 100%   BMI 31.41 kg/m   Physical Exam Vitals signs and nursing note reviewed.  Constitutional:      General: She is not in acute distress.    Appearance: Normal appearance. She is well-developed.  Neck:     Musculoskeletal: Normal range of motion and neck supple.  Cardiovascular:     Rate and Rhythm: Normal rate and regular rhythm.  Pulmonary:     Effort: Pulmonary effort is normal. No respiratory distress.     Breath sounds:  Normal breath sounds.  Abdominal:     General: There is no distension.     Palpations: Abdomen is soft.     Tenderness: There is no abdominal tenderness. There is no guarding.  Musculoskeletal: Normal range of motion.        General: No tenderness.  Lymphadenopathy:     Cervical: No cervical adenopathy.  Skin:    General: Skin is warm.     Findings: Erythema and rash present.     Comments: Scattered small erythematous pustules to the mons pubis and bilateral inner thighs.  A single, slightly indurated pustule approximately 1 cm in size to the medial left thigh.  No drainage or surrounding erythema.  Neurological:     General: No focal deficit present.     Mental Status: She is alert and oriented to person, place, and time.     Sensory: No sensory deficit.     Motor: No weakness or abnormal muscle tone.      ED Treatments / Results  Labs (all labs ordered are listed, but only abnormal results are displayed) Labs Reviewed  CBG MONITORING, ED - Abnormal; Notable for the following components:      Result Value   Glucose-Capillary 100 (*)    All other components within normal limits    EKG None  Radiology No results found.  Procedures Procedures (including critical care time)  Medications Ordered in ED Medications  doxycycline (VIBRA-TABS) tablet 100 mg (100 mg Oral Given 03/21/19 1945)  oxyCODONE-acetaminophen (PERCOCET/ROXICET) 5-325 MG per tablet 1 tablet (1 tablet Oral Given 03/21/19 1945)     Initial Impression / Assessment and Plan / ED Course  I have reviewed the triage vital signs and the nursing notes.  Pertinent labs & imaging results that were available during my care of the patient were reviewed by me and considered in my medical decision making (see chart for details).        Patient with likely folliculitis secondary to shaving.  She is well-appearing and nontoxic.  She does have a possibly early developing abscess of the left inner thigh.  I do not feel  this needs I&D at this time.  Patient agrees to treatment plan with warm soaks, antibiotics, and close follow-up.  She was advised to return here in 2 days for recheck if not improving.  Final Clinical Impressions(s) / ED Diagnoses   Final diagnoses:  Folliculitis    ED Discharge Orders    None       Kem Parkinson, [place] 03/21/19  2102    Hayden Rasmussen, MD 03/22/19 [phone]

## 2019-03-21 NOTE — ED Triage Notes (Signed)
Patient reports rash to her pubic area after shaving a few days ago. Patient states it is red and irritated.

## 2021-04-06 ENCOUNTER — Other Ambulatory Visit (HOSPITAL_COMMUNITY): Payer: Self-pay | Admitting: Neurosurgery

## 2021-04-06 ENCOUNTER — Other Ambulatory Visit: Payer: Self-pay | Admitting: Neurosurgery

## 2021-04-06 DIAGNOSIS — G8929 Other chronic pain: Secondary | ICD-10-CM

## 2021-04-06 DIAGNOSIS — M5441 Lumbago with sciatica, right side: Secondary | ICD-10-CM

## 2021-04-12 ENCOUNTER — Ambulatory Visit
Admission: RE | Admit: 2021-04-12 | Discharge: 2021-04-12 | Disposition: A | Discharge status: Home / Self Care | Payer: PRIVATE HEALTH INSURANCE | Source: Ambulatory Visit | Attending: Neurosurgery | Admitting: Neurosurgery

## 2021-04-12 ENCOUNTER — Other Ambulatory Visit: Payer: Self-pay

## 2021-04-12 DIAGNOSIS — G8929 Other chronic pain: Secondary | ICD-10-CM | POA: Insufficient documentation

## 2021-04-12 DIAGNOSIS — M5441 Lumbago with sciatica, right side: Secondary | ICD-10-CM | POA: Insufficient documentation

## 2021-04-12 DIAGNOSIS — M5442 Lumbago with sciatica, left side: Secondary | ICD-10-CM | POA: Diagnosis not present

## 2021-09-11 ENCOUNTER — Encounter (HOSPITAL_COMMUNITY): Payer: Self-pay

## 2021-09-11 ENCOUNTER — Other Ambulatory Visit: Payer: Self-pay

## 2021-09-11 ENCOUNTER — Emergency Department (HOSPITAL_COMMUNITY): Payer: PRIVATE HEALTH INSURANCE

## 2021-09-11 ENCOUNTER — Emergency Department (HOSPITAL_COMMUNITY)
Admission: EM | Admit: 2021-09-11 | Discharge: 2021-09-11 | Disposition: A | Discharge status: Home / Self Care | Payer: PRIVATE HEALTH INSURANCE | Attending: Emergency Medicine | Admitting: Emergency Medicine

## 2021-09-11 DIAGNOSIS — R110 Nausea: Secondary | ICD-10-CM | POA: Diagnosis not present

## 2021-09-11 DIAGNOSIS — R109 Unspecified abdominal pain: Secondary | ICD-10-CM | POA: Diagnosis present

## 2021-09-11 DIAGNOSIS — R1084 Generalized abdominal pain: Secondary | ICD-10-CM | POA: Diagnosis not present

## 2021-09-11 LAB — CBC
HCT: 43.1 % (ref 36.0–46.0)
Hemoglobin: 14.4 g/dL (ref 12.0–15.0)
MCH: 31.2 pg (ref 26.0–34.0)
MCHC: 33.4 g/dL (ref 30.0–36.0)
MCV: 93.5 fL (ref 80.0–100.0)
Platelets: 228 10*3/uL (ref 150–400)
RBC: 4.61 MIL/uL (ref 3.87–5.11)
RDW: 13 % (ref 11.5–15.5)
WBC: 6 10*3/uL (ref 4.0–10.5)
nRBC: 0 % (ref 0.0–0.2)

## 2021-09-11 LAB — COMPREHENSIVE METABOLIC PANEL
ALT: 18 U/L (ref 0–44)
AST: 17 U/L (ref 15–41)
Albumin: 3.9 g/dL (ref 3.5–5.0)
Alkaline Phosphatase: 83 U/L (ref 38–126)
Anion gap: 6 (ref 5–15)
BUN: 7 mg/dL (ref 6–20)
CO2: 29 mmol/L (ref 22–32)
Calcium: 8.6 mg/dL — ABNORMAL LOW (ref 8.9–10.3)
Chloride: 103 mmol/L (ref 98–111)
Creatinine, Ser: 0.67 mg/dL (ref 0.44–1.00)
GFR, Estimated: >60 mL/min (ref >60)
Glucose, Bld: 107 mg/dL — ABNORMAL HIGH (ref 70–99)
Potassium: 3.8 mmol/L (ref 3.5–5.1)
Sodium: 138 mmol/L (ref 135–145)
Total Bilirubin: 0.9 mg/dL (ref 0.3–1.2)
Total Protein: 6.8 g/dL (ref 6.5–8.1)

## 2021-09-11 LAB — LIPASE, BLOOD: Lipase: 28 U/L (ref 11–51)

## 2021-09-11 MED ORDER — IOHEXOL 300 MG/ML  SOLN
100.0000 mL | Freq: Once | INTRAMUSCULAR | Status: AC | PRN
  Administered 2021-09-11: 100 mL via INTRAVENOUS

## 2021-09-11 MED ORDER — ONDANSETRON HCL 4 MG/2ML IJ SOLN
4.0000 mg | Freq: Once | INTRAMUSCULAR | Status: AC
  Administered 2021-09-11: 4 mg via INTRAVENOUS
  Filled 2021-09-11: qty 2

## 2021-09-11 MED ORDER — HYDROMORPHONE HCL 1 MG/ML IJ SOLN
1.0000 mg | Freq: Once | INTRAMUSCULAR | Status: AC
  Administered 2021-09-11: 1 mg via INTRAVENOUS
  Filled 2021-09-11: qty 1

## 2021-09-11 NOTE — ED Notes (Signed)
Pt asking to leave and refused to sign AMA. IV removed and pt seen leaving the dept with steady gait and NAD.

## 2021-09-11 NOTE — Discharge Instructions (Signed)
Discuss your medications with your Physician

## 2021-09-11 NOTE — ED Notes (Signed)
Patient provided with warm blanket for comfort.  Updated on plan of care

## 2021-09-11 NOTE — ED Triage Notes (Signed)
Pt refused ekg in triage, states that she will get the ekg when she gets in a gown in the back, offered to step out to give pt privacy, pt states that she doesn't not want the ekg at this time.

## 2021-09-11 NOTE — ED Triage Notes (Signed)
Pt to er, pt states that two days ago her pmd started her on methocarbamol and gabapentin, states that she is here because she is having abd pain and a warm feeling going down her leg, and some chest pain.

## 2021-09-11 NOTE — ED Provider Notes (Signed)
[facility] Provider Note   CSN: [id number] Arrival date & time: 09/11/21  1617     History  Chief Complaint  Patient presents with   Abdominal Pain    Amber Duarte is a 51 y.o. female.  Pt complains of abdominal pain.  Pt reports her MD started her on gabapentin and methocarbamol 2 days ago.  Pt reports she has had a burning sensation and abdominal pain since starting these medications.    The history is provided by the patient. No language interpreter was used.  Abdominal Pain Pain location:  Generalized Pain quality: aching   Pain severity:  Moderate Timing:  Constant Progression:  Worsening Chronicity:  New Relieved by:  Nothing Worsened by:  Nothing Ineffective treatments:  None tried Associated symptoms: nausea   Risk factors: no alcohol abuse       Home Medications Prior to Admission medications   Medication Sig Start Date End Date Taking? Authorizing Provider  carisoprodol (SOMA) 350 MG tablet Take 350 mg by mouth every 6 (six) hours as needed for muscle spasms.    [provider]  doxycycline (VIBRAMYCIN) 100 MG capsule Take 1 capsule (100 mg total) by mouth 2 (two) times daily. 03/21/19   Triplett, Tammy, PA-C  HYDROcodone-acetaminophen (NORCO/VICODIN) 5-325 MG tablet Take 1 tablet by mouth every 6 (six) hours as needed (for pain).    [provider]  methylPREDNISolone (MEDROL DOSEPAK) 4 MG TBPK tablet As directed 04/17/17   Rancour, Annie Main, MD  mupirocin ointment (BACTROBAN) 2 % Apply to the affected areas 3 times daily for 10 days 03/21/19   Triplett, Tammy, PA-C  oxyCODONE-acetaminophen (PERCOCET/ROXICET) 5-325 MG tablet Take 1 tablet by mouth every 4 (four) hours as needed. 03/21/19   Triplett, Tammy, PA-C  ranitidine (ZANTAC) 150 MG tablet Take 150 mg by mouth 2 (two) times daily as needed (for heartburn/indigestion.).     [provider]      Allergies    Clindamycin/lincomycin    Review of Systems    Review of Systems  Gastrointestinal:  Positive for abdominal pain and nausea.  All other systems reviewed and are negative.  Physical Exam Updated Vital Signs BP (!) 105/59    Pulse 73    Resp 15    Ht 5\' 4"  (1.626 m)    Wt 98.9 kg    SpO2 98%    BMI 37.42 kg/m  Physical Exam Vitals and nursing note reviewed.  Constitutional:      Appearance: She is well-developed.  HENT:     Head: Normocephalic.     Mouth/Throat:     Mouth: Mucous membranes are moist.  Eyes:     Extraocular Movements: Extraocular movements intact.  Cardiovascular:     Rate and Rhythm: Normal rate.     Heart sounds: Normal heart sounds.  Pulmonary:     Effort: Pulmonary effort is normal.  Abdominal:     General: Abdomen is protuberant. Bowel sounds are normal. There is no distension.     Palpations: Abdomen is soft.     Tenderness: There is generalized abdominal tenderness.     Hernia: No hernia is present.  Musculoskeletal:        General: Normal range of motion.     Cervical back: Normal range of motion.  Skin:    General: Skin is warm.  Neurological:     General: No focal deficit present.     Mental Status: She is alert and oriented to person, place, and time.  ED Results / Procedures / Treatments   Labs (all labs ordered are listed, but only abnormal results are displayed) Labs Reviewed  COMPREHENSIVE METABOLIC PANEL - Abnormal; Notable for the following components:      Result Value   Glucose, Bld 107 (*)    Calcium 8.6 (*)    All other components within normal limits  LIPASE, BLOOD  CBC  URINALYSIS, ROUTINE W REFLEX MICROSCOPIC    EKG EKG Interpretation  Date/Time:  Friday September 11 2021 18:12:29 EST Ventricular Rate:  81 PR Interval:  169 QRS Duration: 91 QT Interval:  380 QTC Calculation: 442 R Axis:   59 Text Interpretation: Sinus rhythm Borderline T abnormalities, anterior leads Confirmed by Octaviano Glow [phone]) on 09/11/2021 9:08:47 PM  Radiology CT ABDOMEN PELVIS W  CONTRAST  Result Date: 09/11/2021 CLINICAL DATA:  Abdominal pain, acute, nonlocalized EXAM: CT ABDOMEN AND PELVIS WITH CONTRAST TECHNIQUE: Multidetector CT imaging of the abdomen and pelvis was performed using the standard protocol following bolus administration of intravenous contrast. RADIATION DOSE REDUCTION: This exam was performed according to the departmental dose-optimization program which includes automated exposure control, adjustment of the mA and/or kV according to patient size and/or use of iterative reconstruction technique. CONTRAST:  137mL OMNIPAQUE IOHEXOL 300 MG/ML  SOLN COMPARISON:  None. FINDINGS: Lower chest: No acute abnormality. Hepatobiliary: No focal liver abnormality is seen. Status post cholecystectomy. No unexpected biliary dilatation. Pancreas: Unremarkable. Spleen: Unremarkable. Adrenals/Urinary Tract: Mild adrenal thickening may reflect hyperplasia. Kidneys and bladder are unremarkable. Stomach/Bowel: Stomach is within normal limits. Bowel is normal in caliber. Appendix is not visualized consistent with reported appendectomy. Vascular/Lymphatic: No significant vascular abnormality. No enlarged nodes. Reproductive: Uterus and bilateral adnexa are unremarkable. Other: No free fluid.  No acute abnormality of the abdominal wall. Musculoskeletal: Degenerative disc disease primarily at L5-S1. IMPRESSION: No acute abnormality. Electronically Signed   By: Macy Mis M.D.   On: 09/11/2021 20:53    Procedures Procedures    Medications Ordered in ED Medications  HYDROmorphone (DILAUDID) injection 1 mg (1 mg Intravenous Given 09/11/21 1937)  ondansetron (ZOFRAN) injection 4 mg (4 mg Intravenous Given 09/11/21 1935)  iohexol (OMNIPAQUE) 300 MG/ML solution 100 mL (100 mLs Intravenous Contrast Given 09/11/21 2033)    ED Course/ Medical Decision Making/ A&P                           Medical Decision Making Problems Addressed: Generalized abdominal pain: acute illness or injury     Details: Pt reports pain began after starting methocabamol and gabapentin.  Amount and/or Complexity of Data Reviewed External Data Reviewed: notes. Labs: ordered. Decision-making details documented in ED Course.    Details: CMet, CBC and Lipase reviewed,  no acute abnormality Radiology: ordered and independent interpretation performed. Decision-making details documented in ED Course.    Details: Ct scan ordered, reviewed and interpreted.  no evidence of acute abnormality ECG/medicine tests: ordered and independent interpretation performed. Decision-making details documented in ED Course.    Details: EKG no acute abnormality  Risk Parenteral controlled substances. Risk Details: Pt given IV dilaudid and zofran.  Pt counseled on results   Pt advised to follow up with her MD for ongoing pain management.           Final Clinical Impression(s) / ED Diagnoses Final diagnoses:  Generalized abdominal pain    Rx / DC Orders ED Discharge Orders     None      An After Visit  Summary was printed and given to the patient.    Fransico Meadow, Hershal Coria 09/11/21 2309    Wyvonnia Dusky, MD 09/12/21 [phone]

## 2022-03-01 ENCOUNTER — Telehealth: Payer: Self-pay

## 2022-03-01 DIAGNOSIS — G8929 Other chronic pain: Secondary | ICD-10-CM

## 2022-03-01 NOTE — Telephone Encounter (Signed)
Referral pended for Dr Izora Ribas to sign if OK w/ referral.

## 2022-03-01 NOTE — Telephone Encounter (Signed)
Referral placed.

## 2022-03-01 NOTE — Telephone Encounter (Signed)
-----   Message from Peggyann Shoals sent at 03/01/2022 10:24 AM EDT ----- Regarding: referral Contact: [phone] Patient was seeing Dr. Izora Ribas at [facility] and was going to have surgery. He referred her to a pain clinic at [facility]. They have a neurosurgeon Dr. Lavell Anchors there also. She would like to be referred to [facility] so she can [place]. They do require a referral. Is this something that Dr.Yarbrough can do for her?

## 2022-03-02 NOTE — Telephone Encounter (Signed)
Left detailed message on pt's voice mail that referral was sent.

## 2022-05-18 IMAGING — MR MR LUMBAR SPINE W/O CM
5 series · 30 of 48 positions shown · non-contrast
Comparison: Prior MRI from 06/05/2020.

CLINICAL DATA: Initial evaluation indication for chronic low back
pain for 4 years.

EXAM:
MRI LUMBAR SPINE WITHOUT CONTRAST
TECHNIQUE: Multiplanar, multisequence MR imaging of the lumbar spine was
performed. No intravenous contrast was administered.

[Series 5: T2 · sagittal · 4.0mm · 0.81mm/px · 6 of 17 slices shown (1 of 2)]
[im 1/17]
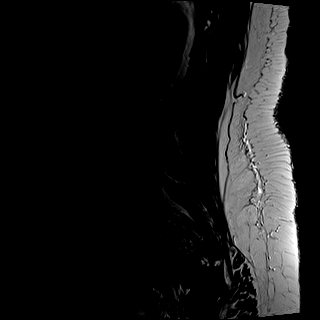
[im 4/17]
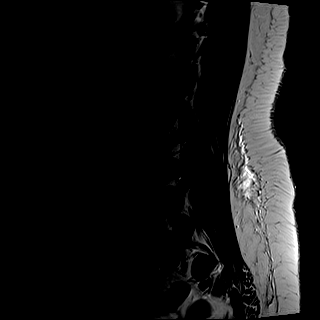
[im 7/17]
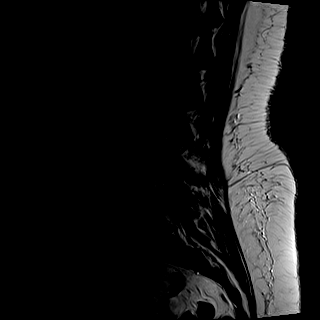
[im 10/17]
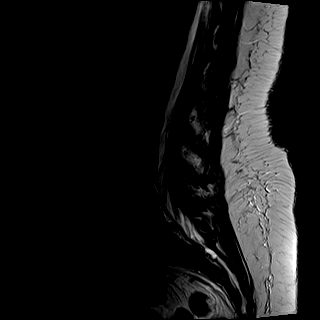
[im 13/17]
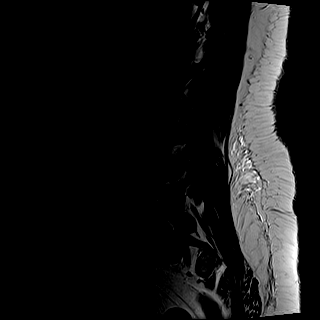
[im 17/17]
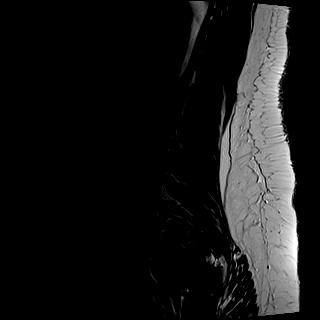

[Series 6: T1 · sagittal · 4.0mm · 0.81mm/px · 7 of 17 slices shown (1 of 2)]
[im 1/17]
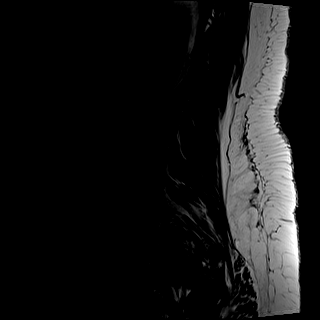
[im 3/17]
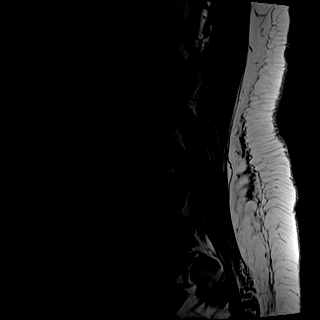
[im 6/17]
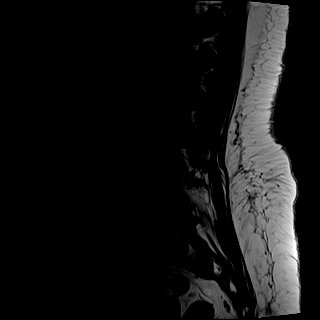
[im 9/17]
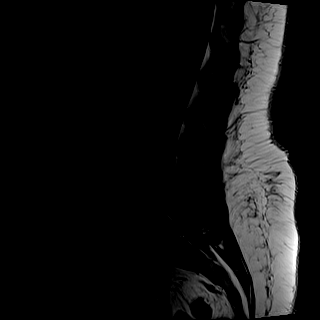
[im 11/17]
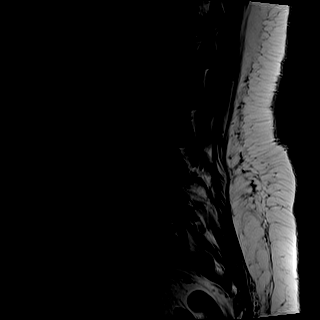
[im 14/17]
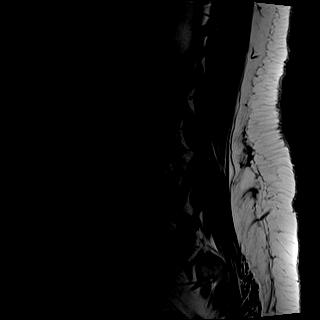
[im 17/17]
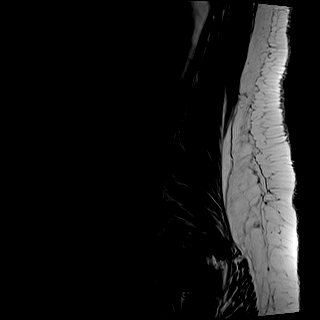

[Series 7: STIR · sagittal · 4.0mm · 0.41mm/px · 1 of 17 slices shown]
[im 1/17]
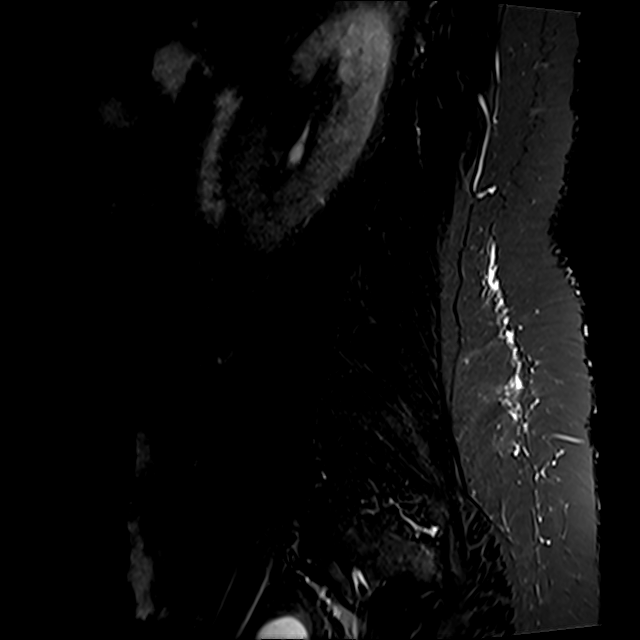

[Series 8: T2 · axial · 4.0mm · 0.78mm/px · z∈[-59,+145]mm · 8 of 36 slices shown (2 of 2)]
[im 1/36]
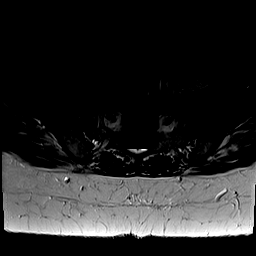
[im 6/36]
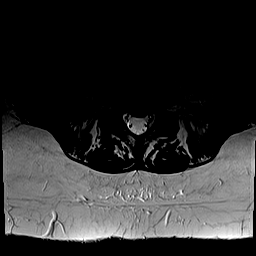
[im 11/36]
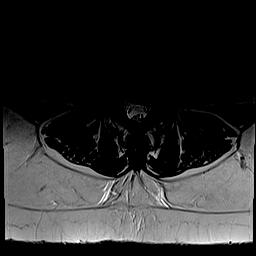
[im 17/36]
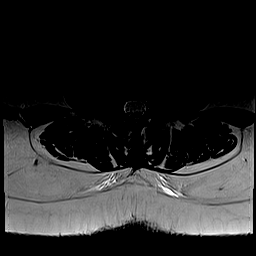
[im 19/36]
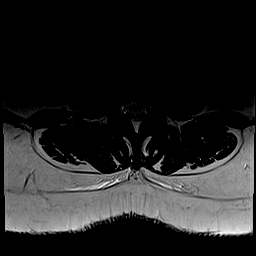
[im 25/36]
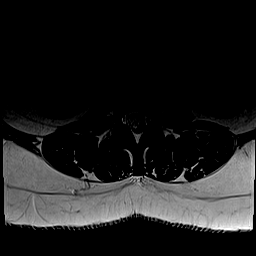
[im 30/36]
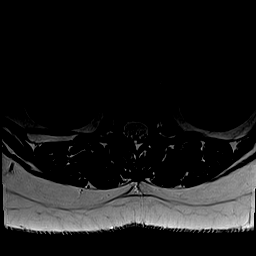
[im 36/36]
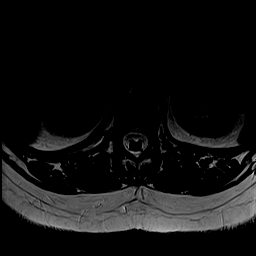

[Series 9: T1 · axial · 4.0mm · 0.39mm/px · z∈[-59,+145]mm · 8 of 36 slices shown (2 of 2)]
[im 1/36]
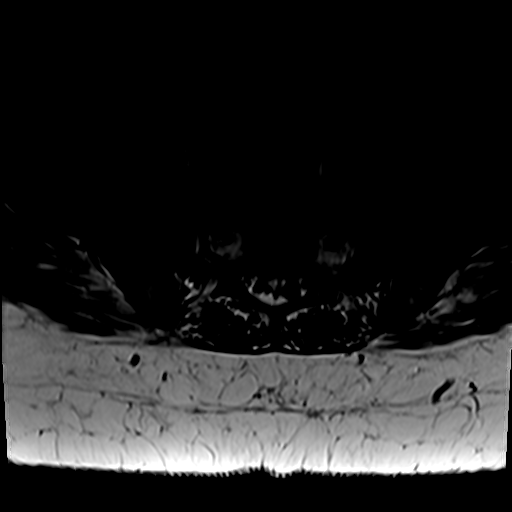
[im 6/36]
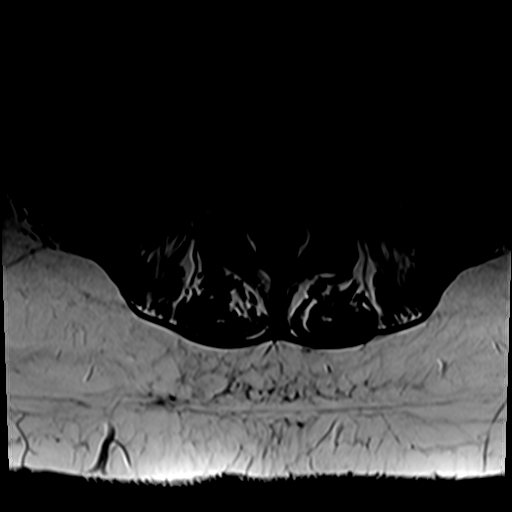
[im 11/36]
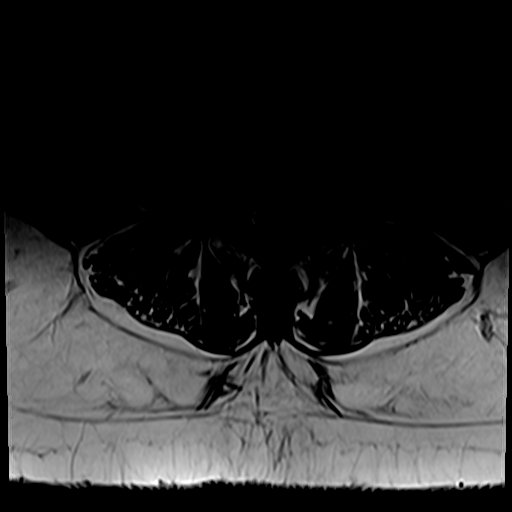
[im 17/36]
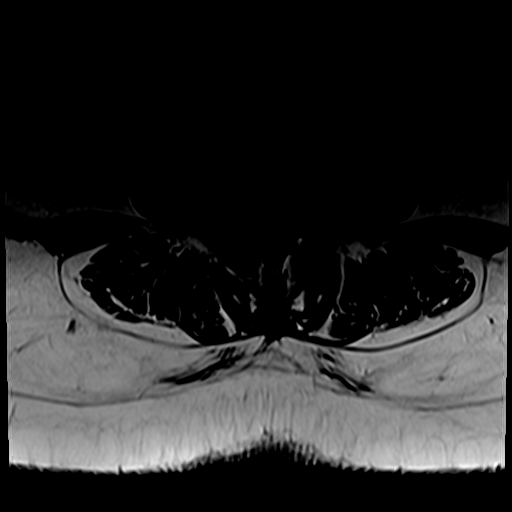
[im 19/36]
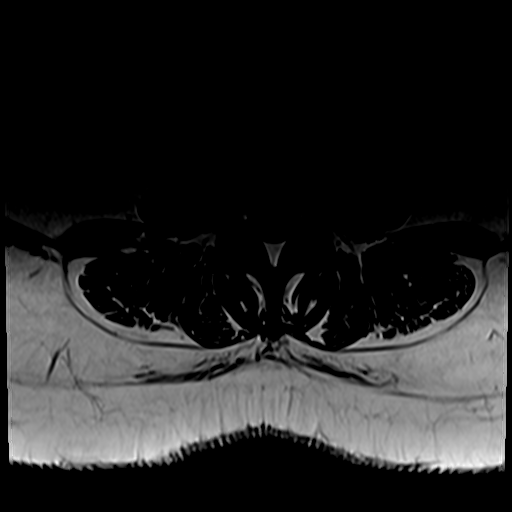
[im 25/36]
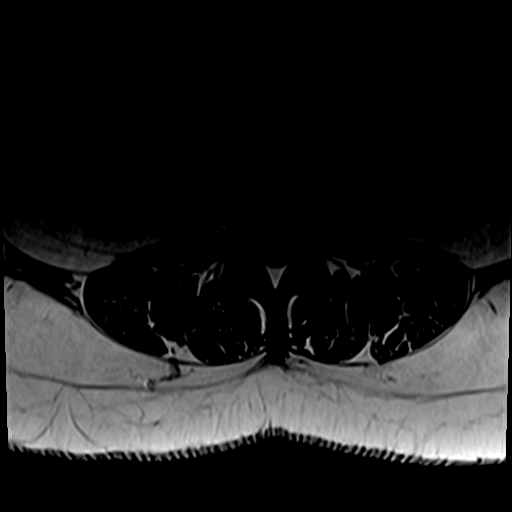
[im 30/36]
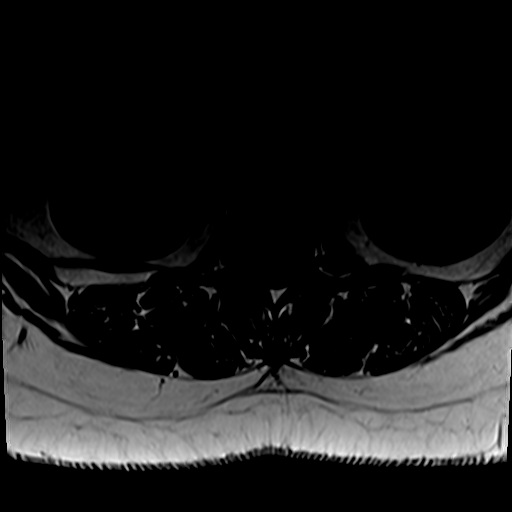
[im 36/36]
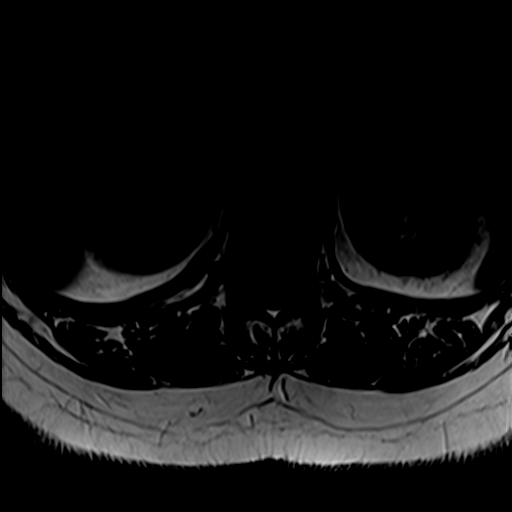

[30 of 48 positions shown; findings below may reference images not displayed]

FINDINGS: Segmentation: Standard. Lowest well-formed disc space labeled the
L5-S1 level.

Alignment: Trace 2 mm retrolisthesis of L2 on L3 and L3 on L4,
stable. Alignment otherwise normal preservation of the normal lumbar
lordosis.

Vertebrae: Vertebral body height maintained without acute or chronic
fracture. Bone marrow signal intensity within normal limits. No
discrete or worrisome osseous lesions. Discogenic reactive endplate
change with marginal endplate osteophytic spurring, progressed from
prior. No abnormal marrow edema.

Conus medullaris and cauda equina: Conus extends to the L1 level.
Conus and cauda equina appear normal.

Paraspinal and other soft tissues: Unremarkable.

Disc levels:

T12-L1: Unremarkable.

L1-2:  Unremarkable.

L2-3: Trace retrolisthesis. Mild disc bulge with disc desiccation.
Small right foraminal disc protrusion closely approximates the
exiting right L2 nerve root, slightly more prominent as compared to
previous (series 8, image 16). Minimal facet hypertrophy. No spinal
stenosis. Mild right L2 foraminal narrowing. No frank impingement.

L3-4: Trace retrolisthesis. Disc desiccation with diffuse disc
bulge. There are superimposed biforaminal to extraforaminal disc
protrusions, slightly larger on the left (series 8, images 22, 23).
Associated annular fissures. Mild facet spurring. No spinal
stenosis. Mild bilateral L3 foraminal narrowing.

L4-5: Disc desiccation with mild disc bulge. Central annular
fissure. Mild facet hypertrophy. No spinal stenosis. Mild bilateral
L4 foraminal narrowing without impingement.

L5-S1: Advanced degenerative intervertebral disc space narrowing
with disc desiccation and diffuse disc bulge. Associated reactive
endplate change with marginal endplate osteophytic spurring. Changes
have mildly progressed from prior. Superimposed small central disc
protrusion again seen. No canal or lateral recess stenosis. Mild
bilateral L5 foraminal stenosis is relatively stable. No frank
impingement.
IMPRESSION: 1. Small right foraminal disc protrusion at L2-3, closely
approximating and potentially irritating the exiting right L2 nerve
root, stable.
2. Small biforaminal to extraforaminal disc protrusions at L3-4,
slightly larger on the left. Appearance is also similar to previous.
3. Advanced degenerative disc disease at L5-S1 with resultant mild
bilateral L5 foraminal stenosis, mildly progressed from prior.

## 2022-07-27 ENCOUNTER — Ambulatory Visit
Admission: EM | Admit: 2022-07-27 | Discharge: 2022-07-27 | Disposition: A | Discharge status: Home / Self Care | Payer: PRIVATE HEALTH INSURANCE

## 2022-07-27 ENCOUNTER — Other Ambulatory Visit: Payer: Self-pay

## 2022-07-27 ENCOUNTER — Encounter: Payer: Self-pay | Admitting: Emergency Medicine

## 2022-07-27 DIAGNOSIS — R600 Localized edema: Secondary | ICD-10-CM

## 2022-07-27 DIAGNOSIS — M79604 Pain in right leg: Secondary | ICD-10-CM | POA: Diagnosis not present

## 2022-07-27 DIAGNOSIS — Z9889 Other specified postprocedural states: Secondary | ICD-10-CM | POA: Diagnosis not present

## 2022-07-27 DIAGNOSIS — M79605 Pain in left leg: Secondary | ICD-10-CM

## 2022-07-27 NOTE — ED Provider Notes (Signed)
[facility]    CSN: [id number] Arrival date & time: 07/27/22  1006      History   Chief Complaint Chief Complaint  Patient presents with   Leg Swelling    HPI Amber Duarte is a 52 y.o. female.   Patient presenting today with 1 day history of sudden onset severe bilateral lower extremity edema extending up to the upper thigh, redness, 12 out of 10 pain, numbness, tingling.  States she is 5 days post lumbar surgery and was feeling overall very well and ambulatory up until yesterday morning when she suddenly developed the symptoms.  She denies fever, chills, body aches, nausea, vomiting, chest pain, shortness of breath.  She states she spoke with her neurosurgeon on the phone yesterday and they told her to go to an urgent care for further evaluation.  She does not have any history of lower extremity edema and no past history of DVTs or bleeding or bruising issues.  She is currently taking hydrocodone and states it is not even touching her pain.    Past Medical History:  Diagnosis Date   Anxiety    Asthma    Cancer ([place])    history of cervical cancer   DDD (degenerative disc disease), cervical    GERD (gastroesophageal reflux disease)     There are no problems to display for this patient.   Past Surgical History:  Procedure Laterality Date   APPENDECTOMY     CHOLECYSTECTOMY     LASER ABLATION CONDYLOMA CERVICAL / VULVAR     laser to cervical d/t cervical cancer 1990    OB History     Gravida      Para      Term      Preterm      AB      Living  0      SAB      IAB      Ectopic      Multiple      Live Births               Home Medications    Prior to Admission medications   Medication Sig Start Date End Date Taking? Authorizing Provider  methocarbamol (ROBAXIN) 750 MG tablet Take 750 mg by mouth 4 (four) times daily.   Yes [provider]  carisoprodol (SOMA) 350 MG tablet Take 350 mg by mouth every 6 (six) hours as  needed for muscle spasms.    [provider]  doxycycline (VIBRAMYCIN) 100 MG capsule Take 1 capsule (100 mg total) by mouth 2 (two) times daily. 03/21/19   Triplett, Tammy, PA-C  HYDROcodone-acetaminophen (NORCO/VICODIN) 5-325 MG tablet Take 1 tablet by mouth every 6 (six) hours as needed (for pain).    [provider]  methylPREDNISolone (MEDROL DOSEPAK) 4 MG TBPK tablet As directed 04/17/17   Rancour, Annie Main, MD  mupirocin ointment (BACTROBAN) 2 % Apply to the affected areas 3 times daily for 10 days 03/21/19   Triplett, Tammy, PA-C  oxyCODONE-acetaminophen (PERCOCET/ROXICET) 5-325 MG tablet Take 1 tablet by mouth every 4 (four) hours as needed. Patient taking differently: Take 1 tablet by mouth every 4 (four) hours as needed. Is currently on 7.5 mg but reports post surgery on 10 mg and once complete that course will restart 7.5 mg. 03/21/19   Triplett, Tammy, PA-C  ranitidine (ZANTAC) 150 MG tablet Take 150 mg by mouth 2 (two) times daily as needed (for heartburn/indigestion.).     [provider]  Family History History reviewed. No pertinent family history.  Social History Social History   Tobacco Use   Smoking status: Former    Packs/day: 1.00    Types: Cigarettes   Smokeless tobacco: Never  Vaping Use   Vaping Use: Former  Substance Use Topics   Alcohol use: Not Currently    Comment: occasional   Drug use: Never     Allergies   Clindamycin/lincomycin   Review of Systems Review of Systems Per HPI  Physical Exam Triage Vital Signs ED Triage Vitals  Enc Vitals Group     BP 07/27/22 1055 98/74     Pulse Rate 07/27/22 1055 97     Resp 07/27/22 1055 18     Temp 07/27/22 1055 98.1 F (36.7 C)     Temp Source 07/27/22 1055 Oral     SpO2 07/27/22 1055 94 %     Weight --      Height --      Head Circumference --      Peak Flow --      Pain Score 07/27/22 1051 10     Pain Loc --      Pain Edu? --      Excl. in [place]? --    No data  found.  Updated Vital Signs BP 98/74 (BP Location: Right Arm) Comment: pt reports baseline bp.  Pulse 97   Temp 98.1 F (36.7 C) (Oral)   Resp 18   LMP 06/07/2022 (Approximate)   SpO2 94%   Visual Acuity Right Eye Distance:   Left Eye Distance:   Bilateral Distance:    Right Eye Near:   Left Eye Near:    Bilateral Near:     Physical Exam Vitals and nursing note reviewed.  Constitutional:      Appearance: Normal appearance. She is not ill-appearing.  HENT:     Head: Atraumatic.     Mouth/Throat:     Mouth: Mucous membranes are moist.  Eyes:     Extraocular Movements: Extraocular movements intact.     Conjunctiva/sclera: Conjunctivae normal.  Cardiovascular:     Rate and Rhythm: Normal rate and regular rhythm.     Heart sounds: Normal heart sounds.     Comments: Good capillary refill to bilateral feet Pulmonary:     Effort: Pulmonary effort is normal.     Breath sounds: Normal breath sounds.  Musculoskeletal:        General: Swelling and tenderness present.     Cervical back: Normal range of motion and neck supple.  Skin:    General: Skin is warm and dry.     Findings: Bruising and erythema present.  Neurological:     Mental Status: She is alert and oriented to person, place, and time.     Comments: Slightly decreased sensation to light touch bilateral feet  Psychiatric:        Mood and Affect: Mood normal.        Thought Content: Thought content normal.        Judgment: Judgment normal.    UC Treatments / Results  Labs (all labs ordered are listed, but only abnormal results are displayed) Labs Reviewed - No data to display  EKG  Radiology No results found.  Procedures Procedures (including critical care time)  Medications Ordered in UC Medications - No data to display  Initial Impression / Assessment and Plan / UC Course  I have reviewed the triage vital signs and the nursing notes.  Pertinent labs &  imaging results that were available during my  care of the patient were reviewed by me and considered in my medical decision making (see chart for details).     Given sudden onset of symptoms, severity of pain despite being on opioid pain medications postop and risk factor of recent major surgery to her lumbar spine, recommended further evaluation in the emergency department.  Did offer to perform labs and order DVT studies to be performed in the near future to further evaluate her symptoms but did discuss the risks of waiting for these test to be performed and come back as we do not have in-house stat options for either.  She wishes to go to the emergency department for immediate evaluation and treatment.  States her husband will take her via private vehicle and declines EMS transport.  Final Clinical Impressions(s) / UC Diagnoses   Final diagnoses:  Bilateral leg edema  Bilateral leg pain  Status post lumbar surgery   Discharge Instructions   None    ED Prescriptions   None    PDMP not reviewed this encounter.   Volney American, [place] 07/27/22 1541

## 2022-07-27 NOTE — ED Notes (Signed)
Patient is being discharged from the Urgent Care and sent to the Emergency Department via POV . Per PA, patient is in need of higher level of care due to post surgical/bilateral leg edema. Patient is aware and verbalizes understanding of plan of care.  Vitals:   07/27/22 1055  BP: 98/74  Pulse: 97  Resp: 18  Temp: 98.1 F (36.7 C)  SpO2: 94%

## 2022-07-27 NOTE — ED Triage Notes (Signed)
Pt reports had L5-S1 cage and metal plate placed x5 days ago. Pt reports called surgeon this morning to notify of bilateral leg swelling and foot pain. Pt reports has tried epsom salt soaks with no change in pain or swelling of BLE. Pt denies any known injury or changes in bowel/bladder.

## 2022-10-17 IMAGING — CT CT ABD-PELV W/ CM
2 of 5 series · 16 of 46 positions shown, 18 images · IV contrast (Omnipaque or Isovue)
Comparison: None.

CLINICAL DATA: Abdominal pain, acute, nonlocalized

EXAM:
CT ABDOMEN AND PELVIS WITH CONTRAST
TECHNIQUE: Multidetector CT imaging of the abdomen and pelvis was performed
using the standard protocol following bolus administration of
intravenous contrast.

[Series 2: axial st · axial · 0.89mm/px · z∈[+682,+1142]mm · 13 of 102 slices shown, 15 images]
[im 5/102  soft-tissue]
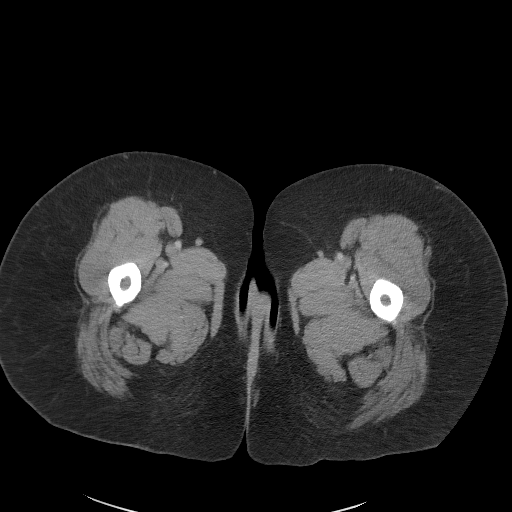
[im 5/102  bone]
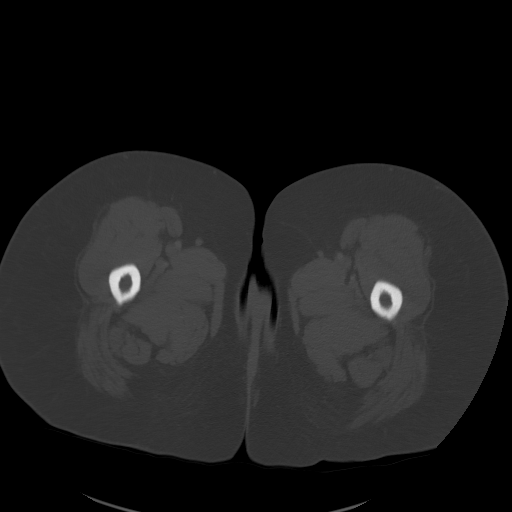
[im 15/102  soft-tissue]
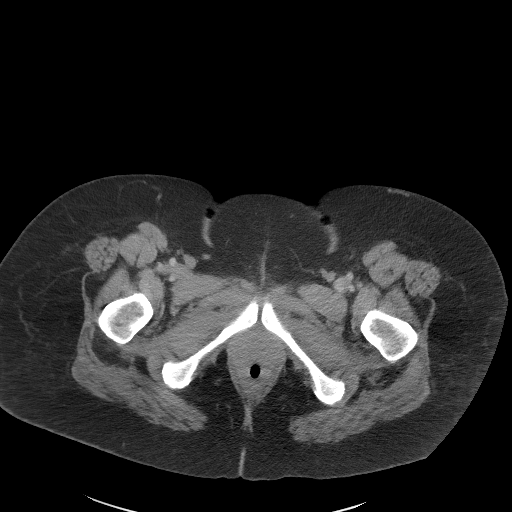
[im 20/102  soft-tissue]
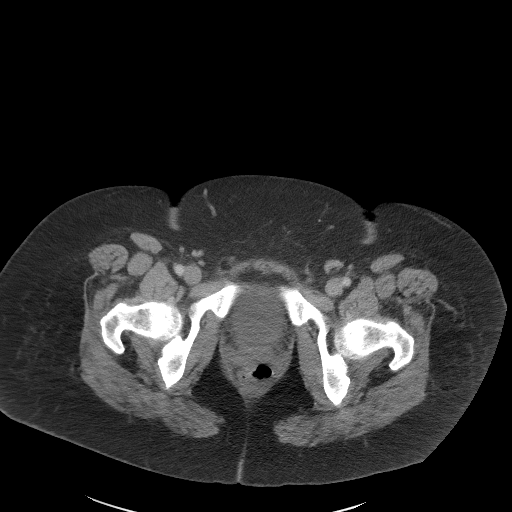
[im 29/102  soft-tissue]
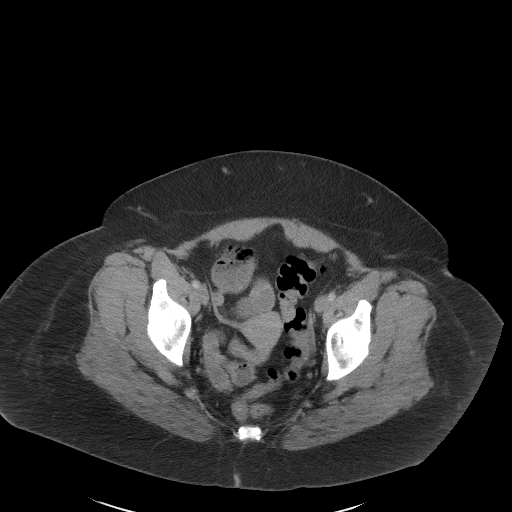
[im 34/102  soft-tissue]
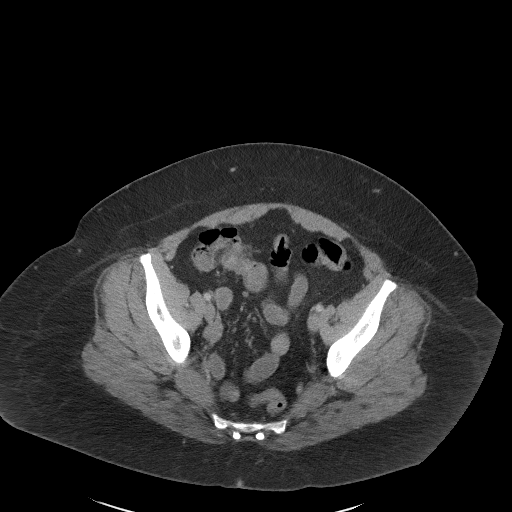
[im 44/102  soft-tissue]
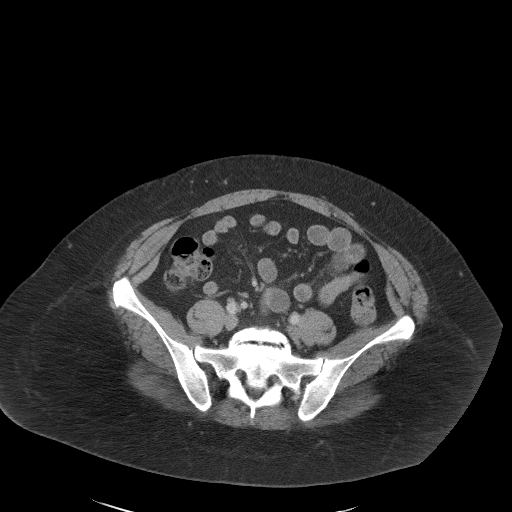
[im 53/102  soft-tissue]
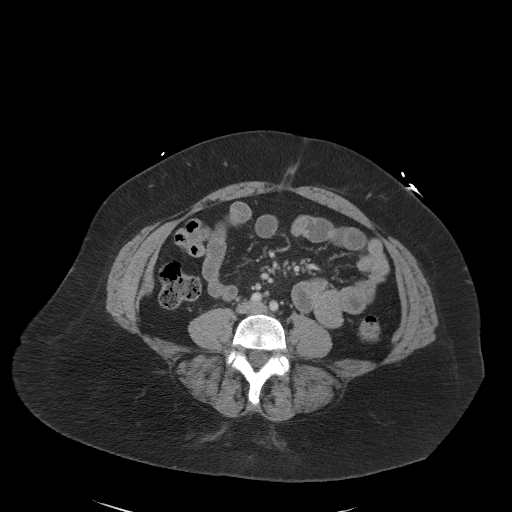
[im 58/102  soft-tissue]
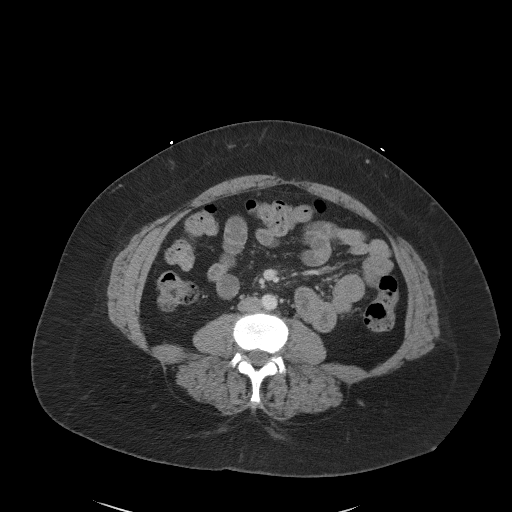
[im 68/102  soft-tissue]
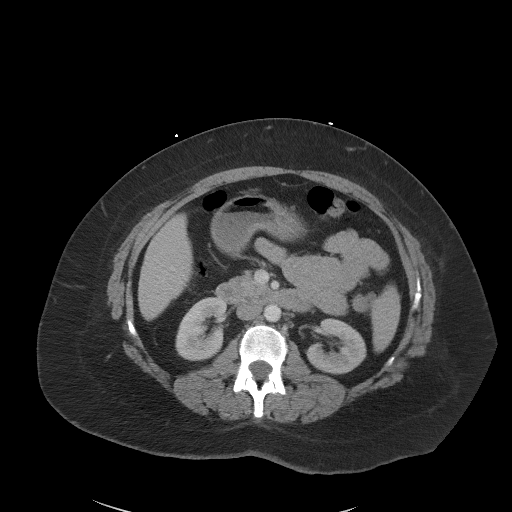
[im 68/102  bone]
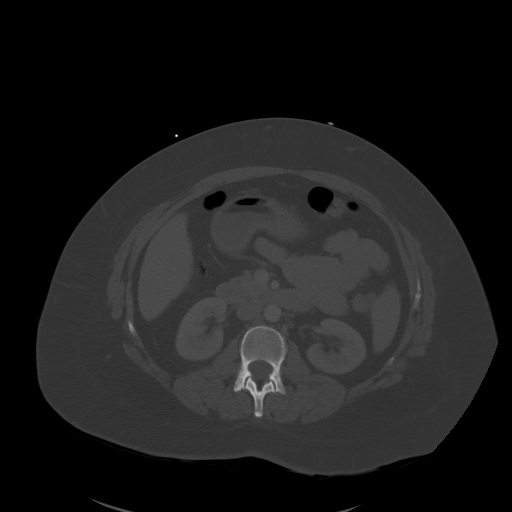
[im 73/102  soft-tissue]
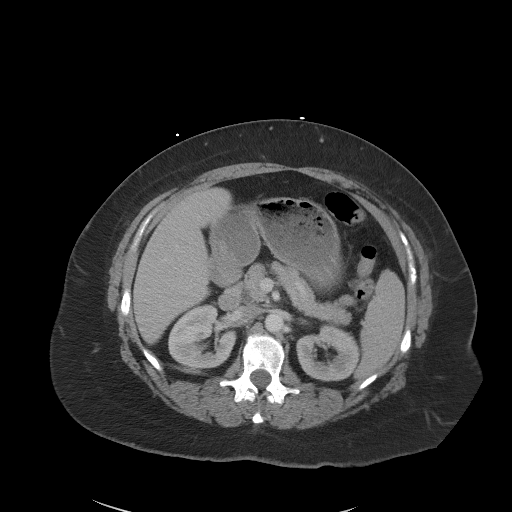
[im 82/102  soft-tissue]
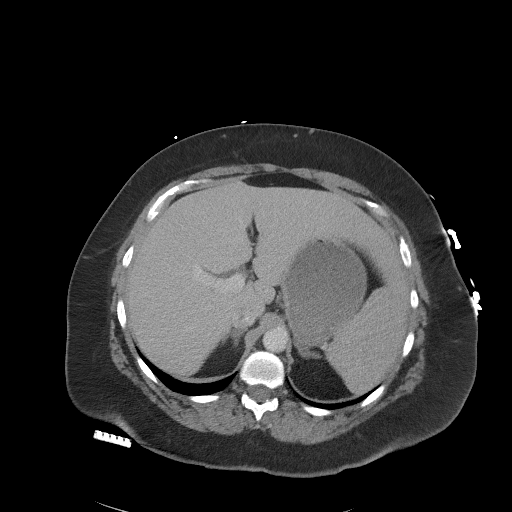
[im 87/102  soft-tissue]
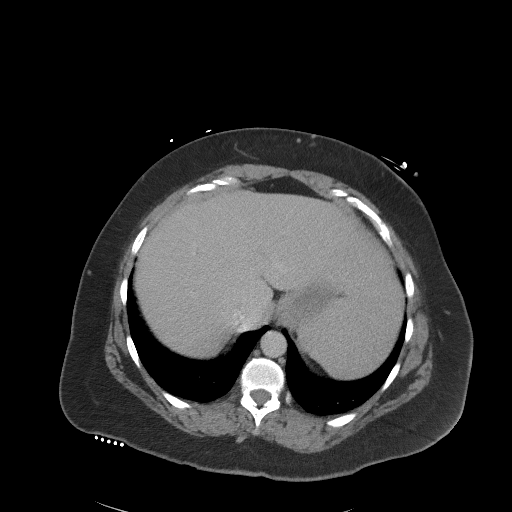
[im 97/102  soft-tissue]
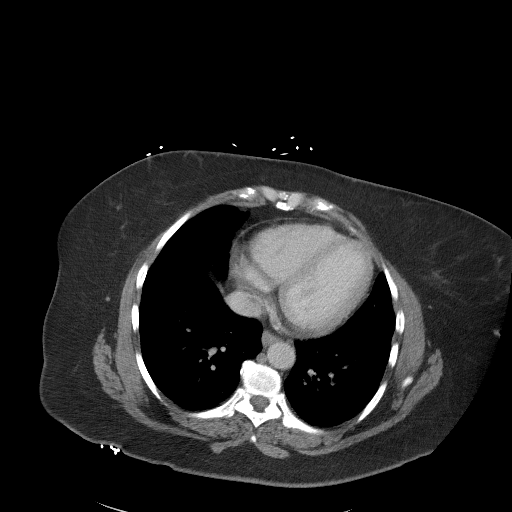

[Series 5: coronal st · coronal · 0.83mm/px · 3 of 107 slices shown]
[im 36/107  soft-tissue]
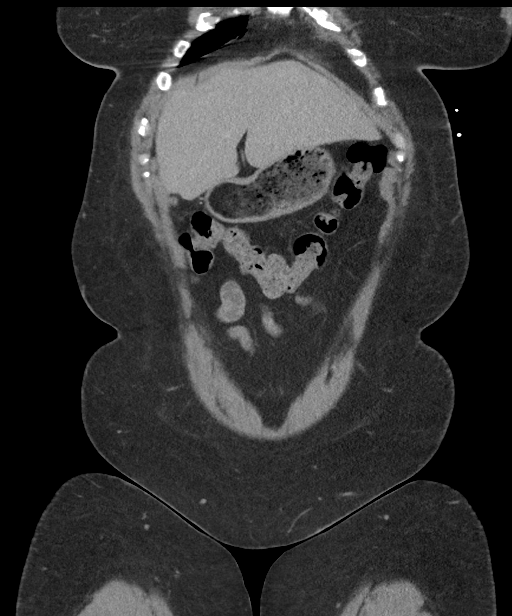
[im 48/107  soft-tissue]
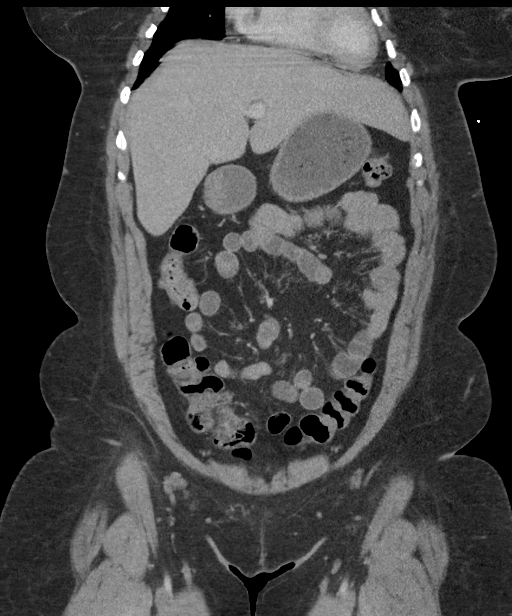
[im 59/107  soft-tissue]
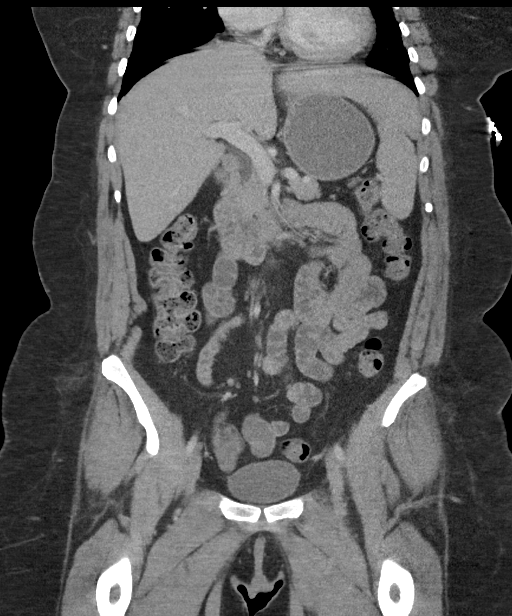

[16 of 46 positions shown; findings below may reference images not displayed]

RADIATION DOSE REDUCTION: This exam was performed according to the
departmental dose-optimization program which includes automated
exposure control, adjustment of the mA and/or kV according to
patient size and/or use of iterative reconstruction technique.

CONTRAST:  100mL OMNIPAQUE IOHEXOL 300 MG/ML  SOLN
FINDINGS: Lower chest: No acute abnormality.

Hepatobiliary: No focal liver abnormality is seen. Status post
cholecystectomy. No unexpected biliary dilatation.

Pancreas: Unremarkable.

Spleen: Unremarkable.

Adrenals/Urinary Tract: Mild adrenal thickening may reflect
hyperplasia. Kidneys and bladder are unremarkable.

Stomach/Bowel: Stomach is within normal limits. Bowel is normal in
caliber. Appendix is not visualized consistent with reported
appendectomy.

Vascular/Lymphatic: No significant vascular abnormality. No enlarged
nodes.

Reproductive: Uterus and bilateral adnexa are unremarkable.

Other: No free fluid.  No acute abnormality of the abdominal wall.

Musculoskeletal: Degenerative disc disease primarily at L5-S1.
IMPRESSION: No acute abnormality.
